# Patient Record
Sex: Female | Born: 1985 | Race: Black or African American | Hispanic: No | Marital: Single | Smoking: Never smoker
Health system: Southern US, Community
[De-identification: ages and names within clinical notes are randomized; demographics above are authoritative.]

## PROBLEM LIST (undated history)

## (undated) DIAGNOSIS — E119 Type 2 diabetes mellitus without complications: Secondary | ICD-10-CM

## (undated) DIAGNOSIS — F1910 Other psychoactive substance abuse, uncomplicated: Secondary | ICD-10-CM

## (undated) HISTORY — PX: KNEE SURGERY: SHX244

## (undated) HISTORY — PX: WRIST SURGERY: SHX841

---

## 2019-02-17 ENCOUNTER — Other Ambulatory Visit: Payer: Self-pay

## 2019-02-17 ENCOUNTER — Emergency Department (HOSPITAL_COMMUNITY)
Admission: EM | Admit: 2019-02-17 | Discharge: 2019-02-18 | Disposition: A | Discharge status: Home / Self Care | Payer: Medicare Other | Attending: Emergency Medicine | Admitting: Emergency Medicine

## 2019-02-17 ENCOUNTER — Encounter (HOSPITAL_COMMUNITY): Payer: Self-pay | Admitting: Emergency Medicine

## 2019-02-17 DIAGNOSIS — R739 Hyperglycemia, unspecified: Secondary | ICD-10-CM | POA: Diagnosis present

## 2019-02-17 DIAGNOSIS — Z5321 Procedure and treatment not carried out due to patient leaving prior to being seen by health care provider: Secondary | ICD-10-CM | POA: Diagnosis not present

## 2019-02-17 HISTORY — DX: Type 2 diabetes mellitus without complications: E11.9

## 2019-02-17 NOTE — ED Triage Notes (Signed)
Patient complaining of not feeling good. Patient states that her sugar has been high for last few days. Patient brought in by [facility]. Patient refused care in the back of the ambulance.

## 2019-02-17 NOTE — ED Notes (Addendum)
Pt refuses blood work CBG and is currently eating a bag of cheese puffs and is drinking soda which she also refuses to give up or stop eating. "This is my personal food. Y'all dont have to feed me I will eat it if I want to."

## 2019-02-18 ENCOUNTER — Encounter (HOSPITAL_COMMUNITY): Payer: Self-pay | Admitting: Emergency Medicine

## 2019-02-18 ENCOUNTER — Other Ambulatory Visit: Payer: Self-pay

## 2019-02-18 ENCOUNTER — Emergency Department (EMERGENCY_DEPARTMENT_HOSPITAL)
Admission: EM | Admit: 2019-02-18 | Discharge: 2019-02-19 | Disposition: A | Discharge status: Home / Self Care | Payer: Medicare Other | Source: Home / Self Care | Attending: Emergency Medicine | Admitting: Emergency Medicine

## 2019-02-18 ENCOUNTER — Emergency Department (HOSPITAL_COMMUNITY)
Admission: EM | Admit: 2019-02-18 | Discharge: 2019-02-18 | Disposition: A | Discharge status: Home / Self Care | Payer: Medicare Other | Attending: Emergency Medicine | Admitting: Emergency Medicine

## 2019-02-18 DIAGNOSIS — R739 Hyperglycemia, unspecified: Secondary | ICD-10-CM

## 2019-02-18 DIAGNOSIS — F419 Anxiety disorder, unspecified: Secondary | ICD-10-CM | POA: Diagnosis not present

## 2019-02-18 DIAGNOSIS — R45851 Suicidal ideations: Secondary | ICD-10-CM

## 2019-02-18 DIAGNOSIS — F1590 Other stimulant use, unspecified, uncomplicated: Secondary | ICD-10-CM | POA: Insufficient documentation

## 2019-02-18 DIAGNOSIS — E1165 Type 2 diabetes mellitus with hyperglycemia: Secondary | ICD-10-CM | POA: Diagnosis present

## 2019-02-18 DIAGNOSIS — Z794 Long term (current) use of insulin: Secondary | ICD-10-CM | POA: Insufficient documentation

## 2019-02-18 DIAGNOSIS — F1494 Cocaine use, unspecified with cocaine-induced mood disorder: Secondary | ICD-10-CM | POA: Diagnosis not present

## 2019-02-18 DIAGNOSIS — Z590 Homelessness: Secondary | ICD-10-CM | POA: Insufficient documentation

## 2019-02-18 LAB — CBC WITH DIFFERENTIAL/PLATELET
Abs Immature Granulocytes: 0.02 10*3/uL (ref 0.00–0.07)
Basophils Absolute: 0 10*3/uL (ref 0.0–0.1)
Basophils Relative: 0 %
Eosinophils Absolute: 0.2 10*3/uL (ref 0.0–0.5)
Eosinophils Relative: 3 %
HCT: 38.7 % (ref 36.0–46.0)
Hemoglobin: 12.3 g/dL (ref 12.0–15.0)
Immature Granulocytes: 0 %
Lymphocytes Relative: 47 %
Lymphs Abs: 4.2 10*3/uL — ABNORMAL HIGH (ref 0.7–4.0)
MCH: 26.4 pg (ref 26.0–34.0)
MCHC: 31.8 g/dL (ref 30.0–36.0)
MCV: 83 fL (ref 80.0–100.0)
Monocytes Absolute: 0.6 10*3/uL (ref 0.1–1.0)
Monocytes Relative: 7 %
Neutro Abs: 3.8 10*3/uL (ref 1.7–7.7)
Neutrophils Relative %: 43 %
Platelets: 373 10*3/uL (ref 150–400)
RBC: 4.66 MIL/uL (ref 3.87–5.11)
RDW: 15.5 % (ref 11.5–15.5)
WBC: 8.9 10*3/uL (ref 4.0–10.5)
nRBC: 0 % (ref 0.0–0.2)

## 2019-02-18 LAB — BASIC METABOLIC PANEL
Anion gap: 7 (ref 5–15)
BUN: <5 mg/dL — ABNORMAL LOW (ref 6–20)
CO2: 24 mmol/L (ref 22–32)
Calcium: 8.5 mg/dL — ABNORMAL LOW (ref 8.9–10.3)
Chloride: 103 mmol/L (ref 98–111)
Creatinine, Ser: 0.54 mg/dL (ref 0.44–1.00)
GFR calc Af Amer: >60 mL/min (ref >60)
GFR calc non Af Amer: >60 mL/min (ref >60)
Glucose, Bld: 438 mg/dL — ABNORMAL HIGH (ref 70–99)
Potassium: 3.9 mmol/L (ref 3.5–5.1)
Sodium: 134 mmol/L — ABNORMAL LOW (ref 135–145)

## 2019-02-18 LAB — CBG MONITORING, ED: Glucose-Capillary: 416 mg/dL — ABNORMAL HIGH (ref 70–99)

## 2019-02-18 LAB — I-STAT BETA HCG BLOOD, ED (MC, WL, AP ONLY): I-stat hCG, quantitative: <5 m[IU]/mL (ref <5)

## 2019-02-18 MED ORDER — SODIUM CHLORIDE 0.9 % IV BOLUS (SEPSIS)
1000.0000 mL | Freq: Once | INTRAVENOUS | Status: AC
  Administered 2019-02-18: 07:00:00 1000 mL via INTRAVENOUS

## 2019-02-18 MED ORDER — SODIUM CHLORIDE 0.9 % IV BOLUS (SEPSIS)
1000.0000 mL | Freq: Once | INTRAVENOUS | Status: AC
  Administered 2019-02-18: 1000 mL via INTRAVENOUS

## 2019-02-18 NOTE — ED Notes (Signed)
Bed: WLPT2 Expected date:  Expected time:  Means of arrival:  Comments: 

## 2019-02-18 NOTE — ED Provider Notes (Signed)
[place] DEPT Provider Note   CSN: [id number] Arrival date & time: 02/18/19  0406     History   Chief Complaint Chief Complaint  Patient presents with  . Hyperglycemia    HPI Dana Noble is a 33 y.o. female.     The history is provided by the patient.  Hyperglycemia Severity:  Moderate Onset quality:  Gradual Timing:  Constant Progression:  Worsening Chronicity:  New Relieved by:  Nothing Associated symptoms: no abdominal pain, no fever and no vomiting   Patient with history of diabetes presents with hyperglycemia She reports she does not have her insulin.  Denies fever/vomiting.  No cough or shortness of breath She reports she is homeless, and is exhausted because she can find nowhere to sleep She has no other complaints Past Medical History:  Diagnosis Date  . Diabetes mellitus without complication ([place])     There are no active problems to display for this patient.   History reviewed. No pertinent surgical history.   OB History   No obstetric history on file.      Home Medications    Prior to Admission medications   Not on File    Family History History reviewed. No pertinent family history.  Social History Social History   Tobacco Use  . Smoking status: Never Smoker  . Smokeless tobacco: Never Used  Substance Use Topics  . Alcohol use: Not Currently  . Drug use: Not Currently     Allergies   Patient has no known allergies.   Review of Systems Review of Systems  Constitutional: Negative for fever.  Gastrointestinal: Negative for abdominal pain and vomiting.  All other systems reviewed and are negative.    Physical Exam Updated Vital Signs BP 127/84 (BP Location: Right Arm)   Pulse 82   Temp 97.9 F (36.6 C) (Oral)   Resp 16   Ht 1.651 m (5\' 5" )   Wt 72.6 kg   SpO2 100%   BMI 26.63 kg/m   Physical Exam CONSTITUTIONAL: Disheveled, mildly anxious, walking around the room HEAD:  Normocephalic/atraumatic EYES: EOMI ENMT: Mucous membranes moist NECK: supple no meningeal signs CV: S1/S2 noted, no murmurs/rubs/gallops noted LUNGS: Lungs are clear to auscultation bilaterally, no apparent distress ABDOMEN: soft, nontender NEURO: Pt is awake/alert/appropriate, moves all extremitiesx4.  No facial droop.   EXTREMITIES: pulses normal/equal, full ROM SKIN: warm, color normal PSYCH: Anxious  ED Treatments / Results  Labs (all labs ordered are listed, but only abnormal results are displayed) Labs Reviewed  BASIC METABOLIC PANEL - Abnormal; Notable for the following components:      Result Value   Sodium 134 (*)    Glucose, Bld 438 (*)    BUN <5 (*)    Calcium 8.5 (*)    All other components within normal limits  CBC WITH DIFFERENTIAL/PLATELET - Abnormal; Notable for the following components:   Lymphs Abs 4.2 (*)    All other components within normal limits  CBG MONITORING, ED - Abnormal; Notable for the following components:   Glucose-Capillary 416 (*)    All other components within normal limits  I-STAT BETA HCG BLOOD, ED ([facility], [facility], [facility] ONLY)    EKG None  Radiology No results found.  Procedures Procedures   Medications Ordered in ED Medications  sodium chloride 0.9 % bolus 1,000 mL (has no administration in time range)  sodium chloride 0.9 % bolus 1,000 mL (1,000 mLs Intravenous New Bag/Given 02/18/19 0536)     Initial Impression /  Assessment and Plan / ED Course  I have reviewed the triage vital signs and the nursing notes.  Pertinent labs  results that were available during my care of the patient were reviewed by me and considered in my medical decision making (see chart for details).        Patient presents for evaluation for hyperglycemia.  She appears mildly anxious.  She initially refused fluids and labs, but she now is agreeable. She reports she does not have her insulin. 6:22 AM Patient resting comfortably, no signs of DKA. Hyperglycemia  noted. Plan will be to give another liter of IV fluids.  Will consult social work for medication assistance.  Otherwise will likely be discharged later today. Final Clinical Impressions(s) / ED Diagnoses   Final diagnoses:  Hyperglycemia    ED Discharge Orders    None       Zadie RhineWickline, Ade Stmarie, MD 02/18/19 [phone]

## 2019-02-18 NOTE — ED Triage Notes (Signed)
Patient came back due to be homeless and no where to go. Patient states she feels like her sugar high. Patient is eating chips and drinking non diet soda.

## 2019-02-18 NOTE — Progress Notes (Signed)
CSW spoke with patient via bedside. Patient stated she needed to find somewhere to sleep. CSW provided patient with a list of homeless shelter and explained the changed process of getting into a shelter with COVID-19 restrictions. Patient stated that she will make the calls and find a place to go. Patient stated that she did not need assistance with getting her medications. No further needs stated. CSW signing off, please reconsult if any other needs arise.   Golden Circle, LCSW Transitions of Care Department [facility] ED [phone]

## 2019-02-18 NOTE — ED Notes (Signed)
Patient does not want an IV or blood drawn until she speak to the doctor.

## 2019-02-18 NOTE — ED Notes (Signed)
Pt d/c home per MD order. Discharge summary reviewed with pt, resources provided. pt ambulatory. No s/s of distress noted.

## 2019-02-18 NOTE — ED Triage Notes (Signed)
Pt presents with being homeless and does not have anywhere to go and all the shelters are full. Pt tearful at time of triage. Pt currently denies any SI/HI.

## 2019-02-18 NOTE — ED Provider Notes (Signed)
Care assumed from Dr. Christy Gentles.  At time of transfer of care, patient is awaiting evaluation by the social work team to help determine what types of assistance patient is appropriate for.  Patient is reportedly new to the area and homeless and needs resources.  Plan of care is to check a glucose before she leaves to make sure it is not uptrending.  If it is, plan to give more fluids.  Glucose improved after fluids and pt given resources. PT discharged in good condition.    Clinical Impression: 1. Hyperglycemia     Disposition: Discharge  Condition: Good  I have discussed the results, Dx and Tx plan with the pt(& family if present). He/she/they expressed understanding and agree(s) with the plan. Discharge instructions discussed at great length. Strict return precautions discussed and pt &/or family have verbalized understanding of the instructions. No further questions at time of discharge.    There are no discharge medications for this patient.   Follow Up: [place] [phone] Schedule an appointment as soon as possible for a visit    [place] DEPT [place] [phone] [place]         Christna Kulick, Gwenyth Allegra, MD 02/18/19 1537

## 2019-02-18 NOTE — ED Notes (Signed)
Patient CBG 175

## 2019-02-18 NOTE — ED Notes (Signed)
Social worker at bedside.

## 2019-02-18 NOTE — Progress Notes (Signed)
Inpatient Diabetes Program Recommendations  AACE/ADA: New Consensus Statement on Inpatient Glycemic Control (2015)  Target Ranges:  Prepandial:   less than 140 mg/dL      Peak postprandial:   less than 180 mg/dL (1-2 hours)      Critically ill patients:  140 - 180 mg/dL   Lab Results  Component Value Date   GLUCAP 416 (H) 02/18/2019    Review of Glycemic Control Results for Dana Noble, Dana Noble (MRN [id number]) as of 02/18/2019 09:57  Ref. Range 02/18/2019 04:16  Glucose-Capillary Latest Ref Range: 70 - 99 mg/dL 416 (H)   Diabetes history: DM- patient states since age 62 Outpatient Diabetes medications:  Lantus/ Novolog (patient was unable to tell me doses) Current orders:  None noted  Inpatient Diabetes Program Recommendations:    Note patient in ER (2nd time coming in last 24 hours).  Spoke to patient by phone.  She was very distant on the phone and only answered after being asked questions multiple times.  She states that she has had diabetes since she was 11.  She had told Education officer, museum that she does not need assistance with medications, however after talking to her she states that she does not have insulin, meter, or a PCP. I told her that she would need these items in order to control her blood sugars and she states "ok".  RN and Education officer, museum state that patient is very irritable and does not seem to want to be bothered. Her address/information indicates she is homeless and there are no other encounters in Epic related to her medical history.  Called and discussed with Education officer, museum.  Also called case manager to discuss further.  ? Need for psych consult?   Will need f/u with clinic and prescriptions for medications.  Thanks  Adah Perl, RN, BC-ADM Inpatient Diabetes Coordinator Pager [phone] (8a-5p)

## 2019-02-18 NOTE — ED Notes (Signed)
Bed: WLPT4 Expected date:  Expected time:  Means of arrival:  Comments: 

## 2019-02-19 DIAGNOSIS — E1165 Type 2 diabetes mellitus with hyperglycemia: Secondary | ICD-10-CM | POA: Diagnosis not present

## 2019-02-19 DIAGNOSIS — F1494 Cocaine use, unspecified with cocaine-induced mood disorder: Secondary | ICD-10-CM | POA: Diagnosis present

## 2019-02-19 LAB — URINALYSIS, ROUTINE W REFLEX MICROSCOPIC
Bilirubin Urine: NEGATIVE
Glucose, UA: >=500 mg/dL — AB
Hgb urine dipstick: NEGATIVE
Ketones, ur: NEGATIVE mg/dL
Leukocytes,Ua: NEGATIVE
Nitrite: NEGATIVE
Protein, ur: NEGATIVE mg/dL
Specific Gravity, Urine: 1.02 (ref 1.005–1.030)
pH: 6 (ref 5.0–8.0)

## 2019-02-19 LAB — CBC WITH DIFFERENTIAL/PLATELET
Abs Immature Granulocytes: 0.02 10*3/uL (ref 0.00–0.07)
Basophils Absolute: 0 10*3/uL (ref 0.0–0.1)
Basophils Relative: 0 %
Eosinophils Absolute: 0.2 10*3/uL (ref 0.0–0.5)
Eosinophils Relative: 2 %
HCT: 38 % (ref 36.0–46.0)
Hemoglobin: 12 g/dL (ref 12.0–15.0)
Immature Granulocytes: 0 %
Lymphocytes Relative: 37 %
Lymphs Abs: 3.4 10*3/uL (ref 0.7–4.0)
MCH: 26.1 pg (ref 26.0–34.0)
MCHC: 31.6 g/dL (ref 30.0–36.0)
MCV: 82.6 fL (ref 80.0–100.0)
Monocytes Absolute: 0.5 10*3/uL (ref 0.1–1.0)
Monocytes Relative: 5 %
Neutro Abs: 5 10*3/uL (ref 1.7–7.7)
Neutrophils Relative %: 56 %
Platelets: 343 10*3/uL (ref 150–400)
RBC: 4.6 MIL/uL (ref 3.87–5.11)
RDW: 15.4 % (ref 11.5–15.5)
WBC: 9.2 10*3/uL (ref 4.0–10.5)
nRBC: 0 % (ref 0.0–0.2)

## 2019-02-19 LAB — COMPREHENSIVE METABOLIC PANEL
ALT: 12 U/L (ref 0–44)
AST: 12 U/L — ABNORMAL LOW (ref 15–41)
Albumin: 3.2 g/dL — ABNORMAL LOW (ref 3.5–5.0)
Alkaline Phosphatase: 76 U/L (ref 38–126)
Anion gap: 10 (ref 5–15)
BUN: 6 mg/dL (ref 6–20)
CO2: 22 mmol/L (ref 22–32)
Calcium: 8.9 mg/dL (ref 8.9–10.3)
Chloride: 101 mmol/L (ref 98–111)
Creatinine, Ser: 0.55 mg/dL (ref 0.44–1.00)
GFR calc Af Amer: >60 mL/min (ref >60)
GFR calc non Af Amer: >60 mL/min (ref >60)
Glucose, Bld: 432 mg/dL — ABNORMAL HIGH (ref 70–99)
Potassium: 4.2 mmol/L (ref 3.5–5.1)
Sodium: 133 mmol/L — ABNORMAL LOW (ref 135–145)
Total Bilirubin: <0.1 mg/dL — ABNORMAL LOW (ref 0.3–1.2)
Total Protein: 6.8 g/dL (ref 6.5–8.1)

## 2019-02-19 LAB — RAPID URINE DRUG SCREEN, HOSP PERFORMED
Amphetamines: NOT DETECTED
Barbiturates: NOT DETECTED
Benzodiazepines: NOT DETECTED
Cocaine: POSITIVE — AB
Opiates: NOT DETECTED
Tetrahydrocannabinol: NOT DETECTED

## 2019-02-19 LAB — I-STAT BETA HCG BLOOD, ED (MC, WL, AP ONLY): I-stat hCG, quantitative: <5 m[IU]/mL (ref <5)

## 2019-02-19 LAB — ETHANOL: Alcohol, Ethyl (B): <10 mg/dL (ref <10)

## 2019-02-19 NOTE — Discharge Instructions (Addendum)
Please follow-up with your outpatient psychiatry team.  If any symptoms change or worsen, please return to the extremity department.

## 2019-02-19 NOTE — ED Provider Notes (Signed)
9:32 AM Nursing reports that psychiatry saw the patient and felt that she was no longer suicidal and was safe for discharge.  Patient will be discharged to follow-up with outpatient psychiatry team.   Tegeler, Gwenyth Allegra, MD 02/19/19 1549

## 2019-02-19 NOTE — ED Provider Notes (Signed)
[place] DEPT Provider Note   CSN: [id number] Arrival date & time: 02/18/19  2253    History   Chief Complaint Chief Complaint  Patient presents with  . Suicidal    HPI Dana Noble is a 33 y.o. female with a PMH of Diabetes presenting with suicidal ideations onset today. Patient reports she is homeless and has been very depressed. Patient reports she has been trying to jump in front of cars today. Patient states she has not been able to find shelters and does not want to live. Patient states she has had trouble sleeping. Patient denies HI or hallucinations. Patient denies fever, cough, congestion, nausea, vomiting, abdominal pain, or diarrhea. Patient denies alcohol, tobacco, or drug use. Per chart review, patient was evaluated by the ER yesterday for hyperglycemia.     HPI  Past Medical History:  Diagnosis Date  . Diabetes mellitus without complication ([place])     There are no active problems to display for this patient.   History reviewed. No pertinent surgical history.   OB History   No obstetric history on file.      Home Medications    Prior to Admission medications   Not on File    Family History History reviewed. No pertinent family history.  Social History Social History   Tobacco Use  . Smoking status: Never Smoker  . Smokeless tobacco: Never Used  Substance Use Topics  . Alcohol use: Not Currently  . Drug use: Not Currently     Allergies   Patient has no known allergies.   Review of Systems Review of Systems  Constitutional: Negative for activity change, appetite change, fatigue, fever and unexpected weight change.  HENT: Negative for congestion, rhinorrhea and sore throat.   Respiratory: Negative for cough and shortness of breath.   Cardiovascular: Negative for chest pain.  Gastrointestinal: Negative for abdominal pain, diarrhea, nausea and vomiting.  Endocrine: Negative for cold intolerance and heat  intolerance.  Genitourinary: Negative for dysuria, flank pain and frequency.  Musculoskeletal: Negative for back pain.  Skin: Negative for rash.  Allergic/Immunologic: Negative for immunocompromised state.  Neurological: Negative for dizziness, weakness and headaches.  Psychiatric/Behavioral: Positive for dysphoric mood, sleep disturbance and suicidal ideas. Negative for agitation, behavioral problems, confusion, decreased concentration, hallucinations and self-injury. The patient is not nervous/anxious and is not hyperactive.     Physical Exam Updated Vital Signs BP (!) 148/91 (BP Location: Left Arm)   Pulse 79   Temp 98.2 F (36.8 C) (Oral)   Resp 18   LMP  (LMP Unknown)   SpO2 100%   Physical Exam Vitals signs and nursing note reviewed.  Constitutional:      General: She is not in acute distress.    Appearance: She is well-developed. She is not diaphoretic.     Comments: Patient is tearful throughout history.  HENT:     Head: Normocephalic and atraumatic.     Mouth/Throat:     Mouth: Mucous membranes are moist.     Pharynx: No oropharyngeal exudate or posterior oropharyngeal erythema.  Eyes:     Extraocular Movements: Extraocular movements intact.     Conjunctiva/sclera: Conjunctivae normal.     Pupils: Pupils are equal, round, and reactive to light.  Neck:     Musculoskeletal: Normal range of motion.  Cardiovascular:     Rate and Rhythm: Normal rate and regular rhythm.     Heart sounds: Normal heart sounds. No murmur. No friction rub. No gallop.   Pulmonary:  Effort: Pulmonary effort is normal. No respiratory distress.     Breath sounds: Normal breath sounds. No wheezing or rales.  Abdominal:     Palpations: Abdomen is soft.     Tenderness: There is no abdominal tenderness.  Musculoskeletal: Normal range of motion.  Skin:    General: Skin is warm.     Findings: No erythema or rash.  Neurological:     Mental Status: She is alert.  Psychiatric:         Attention and Perception: Attention normal.        Mood and Affect: Mood is depressed. Mood is not anxious or elated. Affect is angry and tearful. Affect is not labile, blunt, flat or inappropriate.        Speech: Speech normal.        Behavior: Behavior is agitated. Behavior is not slowed, aggressive, withdrawn, hyperactive or combative.        Thought Content: Thought content is not paranoid or delusional. Thought content includes suicidal ideation. Thought content does not include homicidal ideation. Thought content includes suicidal plan. Thought content does not include homicidal plan.        Cognition and Memory: Cognition normal.        Judgment: Judgment is impulsive.    ED Treatments / Results  Labs (all labs ordered are listed, but only abnormal results are displayed) Labs Reviewed  COMPREHENSIVE METABOLIC PANEL  ETHANOL  RAPID URINE DRUG SCREEN, HOSP PERFORMED  CBC WITH DIFFERENTIAL/PLATELET  URINALYSIS, ROUTINE W REFLEX MICROSCOPIC  I-STAT BETA HCG BLOOD, ED ([facility], [facility], [facility] ONLY)  CBG MONITORING, ED    EKG None  Radiology No results found.  Procedures Procedures (including critical care time)  Medications Ordered in ED Medications - No data to display   Initial Impression / Assessment and Plan / ED Course  I have reviewed the triage vital signs and the nursing notes.  Pertinent labs & imaging results that were available during my care of the patient were reviewed by me and considered in my medical decision making (see chart for details).       Patient presents with suicidal thoughts with a plan to run into traffic. Patient will need medical clearance labs prior to TTS consult. Vitals reviewed. Ordered labs. Labs are pending.   At shift change care was transferred to Ivar Drapeob Browning, PA-C who will follow pending studies, re-evaluate and determine disposition.    Final Clinical Impressions(s) / ED Diagnoses   Final diagnoses:  Suicidal ideation    ED Discharge  Orders    None       Leretha DykesHernandez, Emerick Weatherly P, [place]PA-C 02/19/19 0029    Dione BoozeGlick, David, MD 02/19/19 [phone]

## 2019-02-19 NOTE — ED Notes (Signed)
Patient stated that she wanted to leave her purple scrubs on and leave. I said,"I just want to put my shoes on and leave."

## 2019-02-19 NOTE — Consult Note (Addendum)
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 Pt denies suicidal/homicidal ideation, denies auditory/visual hallucinations and does not appear to be responding to internal stimuli. Pt stated she has been homeless for the past 2 months. She has been sleeping outside. She stated she has been using cocaine, UDS positive, BAL negative. Pt declined any resources from Peer Support. She stated she called police and said she was suicidal so she could come to the emergency room and sleep. She wants to be discharged today [facility]. She denies access to weapons and declined to give any contact for collateral information. She declined any outpatient resources offered to her and stated she had everything she needed.  Pt is psychiatrically clear.   Total Time spent with patient: 30 minutes  Past Psychiatric History: As above  Past Medical History:  Past Medical History:  Diagnosis Date  . Diabetes mellitus without complication (HCC)    History reviewed. No pertinent surgical history. Family History: History reviewed. No pertinent family history. Family Psychiatric  None per chart review.  Social History:  Social History   Substance and Sexual Activity  Alcohol Use Not Currently     Social History   Substance and Sexual Activity  Drug Use Not Currently    Social History   Socioeconomic History  . Marital status: Single    Spouse name: Not on file  . Number of children: Not on file  . Years of education: Not on file  . Highest education level: Not on file  Occupational History  . Not on file  Social Needs  . Financial resource strain: Not on file  . Food insecurity    Worry: Not on file    Inability: Not on file   . Transportation needs    Medical: Not on file    Non-medical: Not on file  Tobacco Use  . Smoking status: Never Smoker  . Smokeless tobacco: Never Used  Substance and Sexual Activity  . Alcohol use: Not Currently  . Drug use: Not Currently  . Sexual activity: Not on file  Lifestyle  . Physical activity    Days per week: Not on file    Minutes per session: Not on file  . Stress: Not on file  Relationships  . Social Musicianconnections    Talks on phone: Not on file    Gets together: Not on file    Attends religious service: Not on file    Active member of club or organization: Not on file    Attends meetings of clubs or organizations: Not on file    Relationship status: Not on file  Other Topics Concern  . Not on file  Social History Narrative  . Not on file    Has this patient used any form of tobacco in the last 30 days? (Cigarettes, Smokeless Tobacco, Cigars, and/or Pipes) Prescription not provided because: Patient does not use tobacco products.  Current Medications: No current facility-administered medications for this encounter.    No current outpatient medications on file.    Musculoskeletal: Strength & Muscle Tone: within normal limits Gait & Station: normal Patient leans: N/A  Psychiatric Specialty Exam: Physical Exam  Nursing note and vitals reviewed. Constitutional: She is oriented to person, place, and  time. She appears well-developed and well-nourished.  HENT:  Head: Normocephalic and atraumatic.  Neck: Normal range of motion.  Respiratory: Effort normal.  Musculoskeletal: Normal range of motion.  Neurological: She is alert and oriented to person, place, and time.  Psychiatric: She has a normal mood and affect. Her speech is normal and behavior is normal. Judgment and thought content normal. Cognition and memory are normal.    Review of Systems  Psychiatric/Behavioral: Positive for substance abuse. Negative for suicidal ideas.  All other systems reviewed  and are negative.   Blood pressure (!) 148/91, pulse 79, temperature 98.2 F (36.8 C), temperature source Oral, resp. rate 18, SpO2 100 %.There is no height or weight on file to calculate BMI.  General Appearance: Casual  Eye Contact:  Good  Speech:  Clear and Coherent and Normal Rate  Volume:  Normal  Mood:  Anxious  Affect:  Congruent  Thought Process:  Coherent and Descriptions of Associations: Intact  Orientation:  Full (Time, Place, and Person)  Thought Content:  Logical  Suicidal Thoughts:  No  Homicidal Thoughts:  No  Memory:  Immediate;   Good Recent;   Fair Remote;   Fair  Judgement:  Fair  Insight:  Fair  Psychomotor Activity:  Normal  Concentration:  Concentration: Good and Attention Span: Good  Recall:  Good  Fund of Knowledge:  Good  Language:  Good  Akathisia:  No  Handed:  Right  AIMS (if indicated):   N/A  Assets:  Communication Skills Physical Health Resilience  ADL's:  Intact  Cognition:  WNL  Sleep:   N/A     Demographic Factors:  Adolescent or young adult, Low socioeconomic status and Unemployed  Loss Factors: Financial problems/change in socioeconomic status  Historical Factors: NA  Risk Reduction Factors:   Sense of responsibility to family  Continued Clinical Symptoms:  Alcohol/Substance Abuse/Dependencies  Cognitive Features That Contribute To Risk:  Closed-mindedness    Suicide Risk:  Minimal: No identifiable suicidal ideation.  Patients presenting with no risk factors but with morbid ruminations; may be classified as minimal risk based on the severity of the depressive symptoms    Plan Of Care/Follow-up recommendations:  Activity:  as tolerated Diet:  heart healthy  Disposition and Treatment Plan: Cocaine-induced mood disorder ([place]) Take all medications as prescribed by your outpatient provider Keep all follow-up appointments as scheduled.  Do not consume alcohol or use illegal drugs while on prescription  medications. Report any adverse effects from your medications to your primary care provider promptly.  In the event of recurrent symptoms or worsening symptoms, call 911, a crisis hotline, or go to the nearest emergency department for evaluation.   Ethelene Hal, NP 02/19/2019, 12:08 PM   Patient's chart reviewed. Reviewed the information documented and agree with the treatment plan.  Buford Dresser, DO 02/19/19 5:33 PM

## 2019-02-19 NOTE — ED Provider Notes (Signed)
Signed out at shift change.   Patient is homeless and claims that she wants to run in front of moving traffic to kill herself.   TTS pending.   Montine Circle, PA-C 70/76/15 [id number]    Delora Fuel, MD 37/35/78 [phone]

## 2019-02-19 NOTE — ED Notes (Signed)
Pt refused to comply with orders to keep the door open.   Pt was confrontational with Security.   Pt has been changed and wand.

## 2019-02-19 NOTE — ED Notes (Signed)
Overheard patient stating she just said she was suicidal because she had no where to go-states all the shelters where closed-needing some where to sleep and wash up-denies SI at this time

## 2019-02-25 ENCOUNTER — Emergency Department (HOSPITAL_COMMUNITY)
Admission: EM | Admit: 2019-02-25 | Discharge: 2019-02-26 | Disposition: A | Discharge status: Home / Self Care | Payer: Medicare Other | Attending: Emergency Medicine | Admitting: Emergency Medicine

## 2019-02-25 ENCOUNTER — Other Ambulatory Visit: Payer: Self-pay

## 2019-02-25 ENCOUNTER — Encounter (HOSPITAL_COMMUNITY): Payer: Self-pay | Admitting: Emergency Medicine

## 2019-02-25 DIAGNOSIS — Z7189 Other specified counseling: Secondary | ICD-10-CM | POA: Insufficient documentation

## 2019-02-25 DIAGNOSIS — E1165 Type 2 diabetes mellitus with hyperglycemia: Secondary | ICD-10-CM | POA: Insufficient documentation

## 2019-02-25 DIAGNOSIS — Z590 Homelessness: Secondary | ICD-10-CM | POA: Diagnosis not present

## 2019-02-25 DIAGNOSIS — R739 Hyperglycemia, unspecified: Secondary | ICD-10-CM

## 2019-02-25 LAB — I-STAT BETA HCG BLOOD, ED (MC, WL, AP ONLY): I-stat hCG, quantitative: <5 m[IU]/mL (ref <5)

## 2019-02-25 LAB — BASIC METABOLIC PANEL
Anion gap: 10 (ref 5–15)
BUN: 5 mg/dL — ABNORMAL LOW (ref 6–20)
CO2: 23 mmol/L (ref 22–32)
Calcium: 8.7 mg/dL — ABNORMAL LOW (ref 8.9–10.3)
Chloride: 99 mmol/L (ref 98–111)
Creatinine, Ser: 0.64 mg/dL (ref 0.44–1.00)
GFR calc Af Amer: >60 mL/min (ref >60)
GFR calc non Af Amer: >60 mL/min (ref >60)
Glucose, Bld: 377 mg/dL — ABNORMAL HIGH (ref 70–99)
Potassium: 3.6 mmol/L (ref 3.5–5.1)
Sodium: 132 mmol/L — ABNORMAL LOW (ref 135–145)

## 2019-02-25 LAB — URINALYSIS, ROUTINE W REFLEX MICROSCOPIC
Bilirubin Urine: NEGATIVE
Glucose, UA: >=500 mg/dL — AB
Hgb urine dipstick: NEGATIVE
Ketones, ur: NEGATIVE mg/dL
Leukocytes,Ua: NEGATIVE
Nitrite: NEGATIVE
Protein, ur: NEGATIVE mg/dL
Specific Gravity, Urine: 1.029 (ref 1.005–1.030)
pH: 5 (ref 5.0–8.0)

## 2019-02-25 LAB — CBC
HCT: 36.3 % (ref 36.0–46.0)
Hemoglobin: 11.6 g/dL — ABNORMAL LOW (ref 12.0–15.0)
MCH: 26 pg (ref 26.0–34.0)
MCHC: 32 g/dL (ref 30.0–36.0)
MCV: 81.2 fL (ref 80.0–100.0)
Platelets: 372 10*3/uL (ref 150–400)
RBC: 4.47 MIL/uL (ref 3.87–5.11)
RDW: 15.3 % (ref 11.5–15.5)
WBC: 7.9 10*3/uL (ref 4.0–10.5)
nRBC: 0 % (ref 0.0–0.2)

## 2019-02-25 NOTE — ED Triage Notes (Signed)
Pt arrives via [facility] stating that she wanted to come here because she is homeless and wants to talk to a Education officer, museum. Pt has hx of DM, cbg 508, pt has not been taking her medications.

## 2019-02-25 NOTE — ED Notes (Addendum)
PT called for vitals x3, PT is outside at this time.

## 2019-02-26 DIAGNOSIS — E1165 Type 2 diabetes mellitus with hyperglycemia: Secondary | ICD-10-CM | POA: Diagnosis not present

## 2019-02-26 LAB — CBG MONITORING, ED: Glucose-Capillary: 453 mg/dL — ABNORMAL HIGH (ref 70–99)

## 2019-02-26 MED ORDER — SODIUM CHLORIDE 0.9 % IV BOLUS
1000.0000 mL | Freq: Once | INTRAVENOUS | Status: AC
  Administered 2019-02-26: 1000 mL via INTRAVENOUS

## 2019-02-26 MED ORDER — FREESTYLE SYSTEM KIT: 1.0000 | PACK | 0 refills | Status: DC | PRN

## 2019-02-26 NOTE — ED Notes (Signed)
Pt refused to let vitals be taken

## 2019-02-26 NOTE — Discharge Instructions (Signed)
You did not want to wait to speak with the social worker or case Freight forwarder.  You may return to speak with him later today.  You should check your blood sugar with the meter and keep a record of this.  Establish care with a primary doctor.  Return to the ED with new or worsening symptoms.

## 2019-02-26 NOTE — ED Provider Notes (Signed)
[facility]   CSN: [id number] Arrival date & time: 02/25/19  2131     History   Chief Complaint Chief Complaint  Patient presents with  . Homeless  . Hyperglycemia    HPI Dana Noble is a 33 y.o. female.     When asked why she is in the ED, patient states Pyreddy told 7 nurses are not telling you.  Per her triage note she is homeless and complaining of hyperglycemia wanting to see the Child psychotherapistsocial worker.  Patient states she has insulin at home but cannot afford it has no way to steroid.  She wants to talk to the social worker about having a place to stay.  She states she has been on metformin in the past but could not tolerate it due to vomiting.  States her legs hurt from walking.  She admits to being homeless.  Denies any abdominal pain, nausea or vomiting or fever.  She denies any pain with urination or blood in the urine.  She denies any hallucinations, suicidal thoughts or homicidal thoughts.  The history is provided by the patient.  Hyperglycemia Associated symptoms: no abdominal pain, no chest pain, no dizziness, no dysuria, no fever, no nausea, no vomiting and no weakness     Past Medical History:  Diagnosis Date  . Diabetes mellitus without complication [facility](HCC)     Patient Active Problem List   Diagnosis Date Noted  . 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 No pertinent surgical history.   OB History   No obstetric history on file.      Home Medications    Prior to Admission medications   Not on File    Family History No family history on file.  Social History Social History   Tobacco Use  . Smoking status: Never Smoker  . Smokeless tobacco: Never Used  Substance Use Topics  . Alcohol use: Not Currently  . Drug use: Not Currently     Allergies   Patient has no known allergies.   Review of Systems Review of Systems  Constitutional: Negative for activity change,  appetite change and fever.  HENT: Negative for congestion and rhinorrhea.   Respiratory: Negative for chest tightness.   Cardiovascular: Negative for chest pain.  Gastrointestinal: Negative for abdominal pain, nausea and vomiting.  Genitourinary: Negative for dysuria and hematuria.  Musculoskeletal: Positive for arthralgias and myalgias.  Skin: Negative for rash.  Neurological: Negative for dizziness, weakness and headaches.  Psychiatric/Behavioral: Negative for decreased concentration and suicidal ideas. The patient is not nervous/anxious.    all other systems are negative except as noted in the HPI and PMH.     Physical Exam Updated Vital Signs BP 124/73 (BP Location: Right Arm)   Pulse 76   Temp 98.9 F (37.2 C) (Oral)   Resp 20   LMP  (LMP Unknown)   SpO2 99%   Physical Exam Vitals signs and nursing note reviewed.  Constitutional:      General: She is not in acute distress.    Appearance: She is well-developed.     Comments: Argumentative  HENT:     Head: Normocephalic and atraumatic.     Mouth/Throat:     Pharynx: No oropharyngeal exudate.     Comments: Mildly dry mucous membranes Eyes:     Conjunctiva/sclera: Conjunctivae normal.     Pupils: Pupils are equal, round, and reactive to light.  Neck:     Musculoskeletal: Normal range of  motion and neck supple.     Comments: No meningismus. Cardiovascular:     Rate and Rhythm: Normal rate and regular rhythm.     Heart sounds: Normal heart sounds. No murmur.  Pulmonary:     Effort: Pulmonary effort is normal. No respiratory distress.     Breath sounds: Normal breath sounds.  Abdominal:     Palpations: Abdomen is soft.     Tenderness: There is no abdominal tenderness. There is no guarding or rebound.  Musculoskeletal: Normal range of motion.        General: No tenderness.  Skin:    General: Skin is warm.     Capillary Refill: Capillary refill takes less than 2 seconds.  Neurological:     General: No focal deficit  present.     Mental Status: She is alert and oriented to person, place, and time. Mental status is at baseline.     Cranial Nerves: No cranial nerve deficit.     Motor: No abnormal muscle tone.     Coordination: Coordination normal.     Comments:  5/5 strength throughout. CN 2-12 intact.Equal grip strength.   Psychiatric:        Behavior: Behavior normal.      ED Treatments / Results  Labs (all labs ordered are listed, but only abnormal results are displayed) Labs Reviewed  BASIC METABOLIC PANEL - Abnormal; Notable for the following components:      Result Value   Sodium 132 (*)    Glucose, Bld 377 (*)    BUN 5 (*)    Calcium 8.7 (*)    All other components within normal limits  CBC - Abnormal; Notable for the following components:   Hemoglobin 11.6 (*)    All other components within normal limits  URINALYSIS, ROUTINE W REFLEX MICROSCOPIC - Abnormal; Notable for the following components:   Glucose, UA >=500 (*)    Bacteria, UA RARE (*)    All other components within normal limits  I-STAT BETA HCG BLOOD, ED ([facility], [facility], [facility] ONLY)  CBG MONITORING, ED    EKG None  Radiology No results found.  Procedures Procedures (including critical care time)  Medications Ordered in ED Medications  sodium chloride 0.9 % bolus 1,000 mL (has no administration in time range)     Initial Impression / Assessment and Plan / ED Course  I have reviewed the triage vital signs and the nursing notes.  Pertinent labs & imaging results that were available during my care of the patient were reviewed by me and considered in my medical decision making (see chart for details).        Patient here with hyperglycemia.  Admits to noncompliance at home.  Not suicidal or homicidal.  Has been seen by TTS last week and cleared.  Her blood work is reassuring without evidence of DKA.  States she needs to talk to the Education officer, museum.  Discussed that they are not here at this time of the night.  Patient  has changed her mind and decided she is not going to wait to see the social worker or case Freight forwarder.  She is given a prescription for a glucometer.  Per Education officer, museum note she was seen by them last week and given a list of resources. This was re-provided to her.  She is not suicidal homicidal.  She needs to establish care with a PCP.  Return precautions discussed.  Final Clinical Impressions(s) / ED Diagnoses   Final diagnoses:  None  ED Discharge Orders    None       Zollie Ellery, Jeannett SeniorStephen, MD 02/26/19 [phone]

## 2019-02-26 NOTE — ED Notes (Signed)
PT called x3 no answer for vitals recheck.

## 2019-02-26 NOTE — Discharge Planning (Signed)
[facility] consulted regarding medication assistance for insured patient.  EDCM will confirm that pt has not loss coverage lately.

## 2019-02-26 NOTE — ED Notes (Signed)
Pt discharged from ED; instructions provided and scripts given; Pt encouraged to return to ED if symptoms worsen and to f/u with PCP; Pt verbalized understanding of all instructions 

## 2019-02-28 ENCOUNTER — Emergency Department (EMERGENCY_DEPARTMENT_HOSPITAL)
Admission: EM | Admit: 2019-02-28 | Discharge: 2019-03-01 | Disposition: A | Discharge status: Home / Self Care | Payer: Medicare Other | Source: Home / Self Care | Attending: Emergency Medicine | Admitting: Emergency Medicine

## 2019-02-28 ENCOUNTER — Emergency Department (HOSPITAL_COMMUNITY)
Admission: EM | Admit: 2019-02-28 | Discharge: 2019-02-28 | Disposition: A | Discharge status: Home / Self Care | Payer: Medicare Other | Attending: Emergency Medicine | Admitting: Emergency Medicine

## 2019-02-28 ENCOUNTER — Other Ambulatory Visit: Payer: Self-pay

## 2019-02-28 ENCOUNTER — Observation Stay (HOSPITAL_COMMUNITY): Admission: AD | Admit: 2019-02-28 | Payer: Medicare Other | Source: Intra-hospital | Admitting: Psychiatry

## 2019-02-28 ENCOUNTER — Encounter (HOSPITAL_COMMUNITY): Payer: Self-pay | Admitting: Emergency Medicine

## 2019-02-28 ENCOUNTER — Encounter (HOSPITAL_COMMUNITY): Payer: Self-pay

## 2019-02-28 DIAGNOSIS — Z5900 Homelessness unspecified: Secondary | ICD-10-CM

## 2019-02-28 DIAGNOSIS — Z765 Malingerer [conscious simulation]: Secondary | ICD-10-CM

## 2019-02-28 DIAGNOSIS — Z046 Encounter for general psychiatric examination, requested by authority: Secondary | ICD-10-CM | POA: Diagnosis present

## 2019-02-28 DIAGNOSIS — F314 Bipolar disorder, current episode depressed, severe, without psychotic features: Secondary | ICD-10-CM | POA: Insufficient documentation

## 2019-02-28 DIAGNOSIS — R45851 Suicidal ideations: Secondary | ICD-10-CM

## 2019-02-28 DIAGNOSIS — F4325 Adjustment disorder with mixed disturbance of emotions and conduct: Secondary | ICD-10-CM | POA: Diagnosis not present

## 2019-02-28 DIAGNOSIS — Z20828 Contact with and (suspected) exposure to other viral communicable diseases: Secondary | ICD-10-CM | POA: Insufficient documentation

## 2019-02-28 DIAGNOSIS — F332 Major depressive disorder, recurrent severe without psychotic features: Secondary | ICD-10-CM | POA: Diagnosis not present

## 2019-02-28 DIAGNOSIS — E119 Type 2 diabetes mellitus without complications: Secondary | ICD-10-CM | POA: Insufficient documentation

## 2019-02-28 LAB — COMPREHENSIVE METABOLIC PANEL
ALT: 12 U/L (ref 0–44)
AST: 13 U/L — ABNORMAL LOW (ref 15–41)
Albumin: 3.3 g/dL — ABNORMAL LOW (ref 3.5–5.0)
Alkaline Phosphatase: 93 U/L (ref 38–126)
Anion gap: 8 (ref 5–15)
BUN: 5 mg/dL — ABNORMAL LOW (ref 6–20)
CO2: 23 mmol/L (ref 22–32)
Calcium: 8.8 mg/dL — ABNORMAL LOW (ref 8.9–10.3)
Chloride: 103 mmol/L (ref 98–111)
Creatinine, Ser: 0.55 mg/dL (ref 0.44–1.00)
GFR calc Af Amer: >60 mL/min (ref >60)
GFR calc non Af Amer: >60 mL/min (ref >60)
Glucose, Bld: 345 mg/dL — ABNORMAL HIGH (ref 70–99)
Potassium: 4 mmol/L (ref 3.5–5.1)
Sodium: 134 mmol/L — ABNORMAL LOW (ref 135–145)
Total Bilirubin: 0.5 mg/dL (ref 0.3–1.2)
Total Protein: 7.2 g/dL (ref 6.5–8.1)

## 2019-02-28 LAB — RAPID URINE DRUG SCREEN, HOSP PERFORMED
Amphetamines: NOT DETECTED
Barbiturates: NOT DETECTED
Benzodiazepines: NOT DETECTED
Cocaine: POSITIVE — AB
Opiates: NOT DETECTED
Tetrahydrocannabinol: NOT DETECTED

## 2019-02-28 LAB — CBG MONITORING, ED
Glucose-Capillary: 271 mg/dL — ABNORMAL HIGH (ref 70–99)
Glucose-Capillary: 325 mg/dL — ABNORMAL HIGH (ref 70–99)

## 2019-02-28 LAB — CBC WITH DIFFERENTIAL/PLATELET
Abs Immature Granulocytes: 0.02 10*3/uL (ref 0.00–0.07)
Basophils Absolute: 0 10*3/uL (ref 0.0–0.1)
Basophils Relative: 1 %
Eosinophils Absolute: 0.2 10*3/uL (ref 0.0–0.5)
Eosinophils Relative: 2 %
HCT: 41.7 % (ref 36.0–46.0)
Hemoglobin: 13.1 g/dL (ref 12.0–15.0)
Immature Granulocytes: 0 %
Lymphocytes Relative: 39 %
Lymphs Abs: 2.9 10*3/uL (ref 0.7–4.0)
MCH: 25.9 pg — ABNORMAL LOW (ref 26.0–34.0)
MCHC: 31.4 g/dL (ref 30.0–36.0)
MCV: 82.6 fL (ref 80.0–100.0)
Monocytes Absolute: 0.4 10*3/uL (ref 0.1–1.0)
Monocytes Relative: 6 %
Neutro Abs: 3.9 10*3/uL (ref 1.7–7.7)
Neutrophils Relative %: 52 %
Platelets: 380 10*3/uL (ref 150–400)
RBC: 5.05 MIL/uL (ref 3.87–5.11)
RDW: 15.4 % (ref 11.5–15.5)
WBC: 7.4 10*3/uL (ref 4.0–10.5)
nRBC: 0 % (ref 0.0–0.2)

## 2019-02-28 LAB — ETHANOL: Alcohol, Ethyl (B): <10 mg/dL (ref <10)

## 2019-02-28 LAB — SARS CORONAVIRUS 2 BY RT PCR (HOSPITAL ORDER, PERFORMED IN ~~LOC~~ HOSPITAL LAB): SARS Coronavirus 2: NEGATIVE

## 2019-02-28 LAB — I-STAT BETA HCG BLOOD, ED (MC, WL, AP ONLY): I-stat hCG, quantitative: <5 m[IU]/mL (ref <5)

## 2019-02-28 MED ORDER — METFORMIN HCL 500 MG PO TABS: 500.0000 mg | ORAL_TABLET | Freq: Two times a day (BID) | ORAL | Status: DC

## 2019-02-28 NOTE — ED Triage Notes (Signed)
TTS done 
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
## 2019-02-28 NOTE — ED Notes (Signed)
Pt was re-informed about the snack/food policy that [facility] has while you are a patient. Pt has been getting her sitter to give her snacks & drinks frequently. Pt was explained that water can be given regularly if needed but snacks were less frequented that she is requesting. Pt stated she understood & claimed that where she is homeless she just needed to "rehydrate herself" from being on the streets (per pt).

## 2019-02-28 NOTE — ED Notes (Signed)
Pt informed that she cannot close her door completely. Pt asked if she could "keep the door cracked". Pt stated that she wanted to "get some sleep". Door left slightly ajar.

## 2019-02-28 NOTE — ED Notes (Signed)
Pt verbally aggressive toward staff. Loudly demanding a snack that she reports "was not given at 10am" states that she wants to leave. EDP made aware that pt wants to go home. Pt slammed door. And was asked to open it and became increasingly aggressive. EDP came to bedside and spoke with patient.
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
## 2019-02-28 NOTE — ED Notes (Signed)
All appropriate discharge materials reviewed with patient at length. Time for questions provided. Pt denies any further questions at this time. Verbalizes understanding of all provided materials.  

## 2019-02-28 NOTE — ED Notes (Signed)
Pt given orange juice,

## 2019-02-28 NOTE — ED Triage Notes (Signed)
Pt presents to ED by [facility]. Pt c/o SI w/ no plan. Pt is homeless hx of noncompliance w/ meds. EMS cbg 389. EMS VSS.

## 2019-02-28 NOTE — ED Provider Notes (Signed)
Patient seen at the request of staff.  Patient is loudly demanding to be discharged.  She is not on current IVC hold.  She was being observed overnight.  She now desires discharge.  .  She does not appear to be an acute threat to herself or to others.  She appears at this time to have capacity to refuse care.  Strict return precautions given and understood.  She does understand the need for close follow-up    Valarie Merino, MD 02/28/19 [phone]

## 2019-02-28 NOTE — Discharge Instructions (Signed)
Return for any problem.  Follow-up with your regular care provider as instructed. °

## 2019-02-28 NOTE — ED Notes (Signed)
Lunch tray ordered 

## 2019-03-01 ENCOUNTER — Encounter (HOSPITAL_COMMUNITY): Payer: Self-pay | Admitting: Registered Nurse

## 2019-03-01 DIAGNOSIS — Z590 Homelessness: Secondary | ICD-10-CM | POA: Diagnosis not present

## 2019-03-01 DIAGNOSIS — Z765 Malingerer [conscious simulation]: Secondary | ICD-10-CM

## 2019-03-01 DIAGNOSIS — F4325 Adjustment disorder with mixed disturbance of emotions and conduct: Secondary | ICD-10-CM | POA: Diagnosis present

## 2019-03-01 DIAGNOSIS — F332 Major depressive disorder, recurrent severe without psychotic features: Secondary | ICD-10-CM | POA: Diagnosis not present

## 2019-03-01 LAB — I-STAT BETA HCG BLOOD, ED (MC, WL, AP ONLY): I-stat hCG, quantitative: <5 m[IU]/mL (ref <5)

## 2019-03-01 MED ORDER — ONDANSETRON HCL 4 MG PO TABS: 4.0000 mg | ORAL_TABLET | Freq: Three times a day (TID) | ORAL | Status: DC | PRN

## 2019-03-01 MED ORDER — ALUM & MAG HYDROXIDE-SIMETH 200-200-20 MG/5ML PO SUSP: 30.0000 mL | Freq: Four times a day (QID) | ORAL | Status: DC | PRN

## 2019-03-01 MED ORDER — NICOTINE 21 MG/24HR TD PT24: 21.0000 mg | MEDICATED_PATCH | Freq: Every day | TRANSDERMAL | Status: DC

## 2019-03-01 MED ORDER — ACETAMINOPHEN 325 MG PO TABS: 650.0000 mg | ORAL_TABLET | ORAL | Status: DC | PRN

## 2019-03-01 NOTE — ED Notes (Signed)
[place] 12.

## 2019-03-01 NOTE — ED Provider Notes (Signed)
[facility] EMERGENCY DEPARTMENT Provider Note   CSN: [id number] Arrival date & time: 02/28/19  0408     History   Chief Complaint Chief Complaint  Patient presents with  . Suicidal    HPI Dana Noble is a 33 y.o. female.      Mental Health Problem Presenting symptoms: suicidal threats   Degree of incapacity (severity):  Mild Onset quality:  Sudden Timing:  Intermittent Progression:  Worsening Chronicity:  New Context: noncompliance   Context: not alcohol use   Treatment compliance:  Untreated Relieved by:  None tried Worsened by:  Nothing Ineffective treatments:  None tried Associated symptoms: anhedonia, distractible, fatigue, irritability and poor judgment   Associated symptoms: no abdominal pain     Past Medical History:  Diagnosis Date  . Diabetes mellitus without complication [facility])     Patient Active Problem List   Diagnosis Date Noted  . Cocaine-induced mood disorder ([place]) 02/19/2019    History reviewed. No pertinent surgical history.   OB History   No obstetric history on file.      Home Medications    Prior to Admission medications   Medication Sig Start Date End Date Taking? Authorizing Provider  glucose monitoring kit (FREESTYLE) monitoring kit 1 each by Does not apply route as needed for other. 02/26/19   Ezequiel Essex, MD    Family History History reviewed. No pertinent family history.  Social History Social History   Tobacco Use  . Smoking status: Never Smoker  . Smokeless tobacco: Never Used  Substance Use Topics  . Alcohol use: Not Currently  . Drug use: Not Currently     Allergies   Patient has no known allergies.   Review of Systems Review of Systems  Constitutional: Positive for fatigue and irritability.  Gastrointestinal: Negative for abdominal pain.  All other systems reviewed and are negative.    Physical Exam Updated Vital Signs BP 128/78 (BP Location: Right Arm)   Pulse 84    Temp 98.5 F (36.9 C) (Oral)   Resp 18   Ht _0  (1.702 m)   Wt 75.5 kg   LMP  (LMP Unknown)   SpO2 100%   BMI 26.07 kg/m   Physical Exam Vitals signs and nursing note reviewed.  Constitutional:      Appearance: She is well-developed.  HENT:     Head: Normocephalic and atraumatic.     Nose: Nose normal. No congestion or rhinorrhea.     Mouth/Throat:     Mouth: Mucous membranes are dry.     Pharynx: Oropharynx is clear.  Eyes:     Extraocular Movements: Extraocular movements intact.     Conjunctiva/sclera: Conjunctivae normal.  Neck:     Musculoskeletal: Normal range of motion.  Cardiovascular:     Rate and Rhythm: Normal rate and regular rhythm.  Pulmonary:     Effort: No respiratory distress.     Breath sounds: No stridor.  Abdominal:     General: There is no distension.  Musculoskeletal: Normal range of motion.  Skin:    General: Skin is warm and dry.  Neurological:     General: No focal deficit present.     Mental Status: She is alert.      ED Treatments / Results  Labs (all labs ordered are listed, but only abnormal results are displayed) Labs Reviewed  COMPREHENSIVE METABOLIC PANEL - Abnormal; Notable for the following components:      Result Value   Sodium 134 (*)  Glucose, Bld 345 (*)    BUN 5 (*)    Calcium 8.8 (*)    Albumin 3.3 (*)    AST 13 (*)    All other components within normal limits  RAPID URINE DRUG SCREEN, HOSP PERFORMED - Abnormal; Notable for the following components:   Cocaine POSITIVE (*)    All other components within normal limits  CBC WITH DIFFERENTIAL/PLATELET - Abnormal; Notable for the following components:   MCH 25.9 (*)    All other components within normal limits  CBG MONITORING, ED - Abnormal; Notable for the following components:   Glucose-Capillary 271 (*)    All other components within normal limits  CBG MONITORING, ED - Abnormal; Notable for the following components:   Glucose-Capillary 325 (*)    All other  components within normal limits  SARS CORONAVIRUS 2 (HOSPITAL ORDER, [place] [facility])  ETHANOL  I-STAT BETA HCG BLOOD, ED ([facility], [facility], [facility] ONLY)  I-STAT BETA HCG BLOOD, ED ([facility], [facility], [facility] ONLY)    EKG None  Radiology No results found.  Procedures Procedures (including critical care time)  Medications Ordered in ED Medications - No data to display   Initial Impression / Assessment and Plan / ED Course  I have reviewed the triage vital signs and the nursing notes.  Pertinent labs & imaging results that were available during my care of the patient were reviewed by me and considered in my medical decision making (see chart for details).    Endorses SI without plan, suspect malingering. TTS to consult. Medically cleared.   Final Clinical Impressions(s) / ED Diagnoses   Final diagnoses:  Homelessness    ED Discharge Orders    None       Rebecka Oelkers, Corene Cornea, MD 03/01/19 0111

## 2019-03-01 NOTE — ED Notes (Signed)
Pt provided with washcloths, towels, soap, and warm water to clean herself as requested.
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
## 2019-03-01 NOTE — Consult Note (Addendum)
[facility] Psych ED Discharge  7/49/4496 7:59 PM Dana Noble  MRN:  [id number] Principal Problem: Adjustment disorder with mixed disturbance of emotions and conduct Discharge Diagnoses: Principal Problem:   Adjustment disorder with mixed disturbance of emotions and conduct Active Problems:   Malingering   Subjective: Patient seen via tele psych by this provider, Dr. Mariea Clonts; and  chart reviewed on 03/01/19.  On evaluation Dana Noble reports that she is not feeling well and that she needs help.  Patient asked what she needed help with and she responded.  "I don't know.  I just know that I don't feel well, I'm depressed and I need help."  Patient asked what was causing her to feel depressed.  "I don't know, that's just the way I feel."  Patient states that she is homeless and that she is not currently have outpatient psychiatric services; when asked when she was last seen out patient or if she had been inpatient patient responded "I don't know."  Patient denies suicidal/self-harm/homicidal ideation, psychosis, and paranoia.      During evaluation Dana Noble is laying in bed with covers pulled up to neck; she is irritable, and agitated with questions, yelling "I don't know" to most questions.  She is alert/oriented x 4; Patient is speaking in a clear tone at increased volume, and normal pace; with eyes closed mostly; not wanting to sit up; wanting to be left alone so she can sleep.  Her thought process is coherent and relevant; but refuses to answer most questions.  There is no indication that she is currently responding to internal/external stimuli or experiencing delusional thought content.  Patient denies suicidal/self-harm/homicidal ideation, psychosis, and paranoia. Patient appears to be malingering for secondary gain related to homelessness.  Patient was at [facility] earlier; prior to going to [facility] where she was demanding to be discharged.    Total Time spent with patient: 30 minutes  Past  Psychiatric History: Depression  Past Medical History:  Past Medical History:  Diagnosis Date  . Diabetes mellitus without complication ([place])    History reviewed. No pertinent surgical history. Family History: History reviewed. No pertinent family history. Family Psychiatric  History: Denies Social History:  Social History   Substance and Sexual Activity  Alcohol Use Not Currently     Social History   Substance and Sexual Activity  Drug Use Not Currently    Social History   Socioeconomic History  . Marital status: Single    Spouse name: Not on file  . Number of children: Not on file  . Years of education: Not on file  . Highest education level: Not on file  Occupational History  . Not on file  Social Needs  . Financial resource strain: Not on file  . Food insecurity    Worry: Not on file    Inability: Not on file  . Transportation needs    Medical: Not on file    Non-medical: Not on file  Tobacco Use  . Smoking status: Never Smoker  . Smokeless tobacco: Never Used  Substance and Sexual Activity  . Alcohol use: Not Currently  . Drug use: Not Currently  . Sexual activity: Not on file  Lifestyle  . Physical activity    Days per week: Not on file    Minutes per session: Not on file  . Stress: Not on file  Relationships  . Social Herbalist on phone: Not on file    Gets together: Not on file  Attends religious service: Not on file    Active member of club or organization: Not on file    Attends meetings of clubs or organizations: Not on file    Relationship status: Not on file  Other Topics Concern  . Not on file  Social History Narrative  . Not on file    Has this patient used any form of tobacco in the last 30 days? (Cigarettes, Smokeless Tobacco, Cigars, and/or Pipes) Prescription not provided because: Patient does not use tabocco or tobacco products  Current Medications: Current Facility-Administered Medications  Medication Dose Route  Frequency Provider Last Rate Last Dose  . acetaminophen (TYLENOL) tablet 650 mg  650 mg Oral Q4H PRN Mesner, Corene Cornea, MD      . alum & mag hydroxide-simeth (MAALOX/MYLANTA) 200-200-20 MG/5ML suspension 30 mL  30 mL Oral Q6H PRN Mesner, Corene Cornea, MD      . nicotine (NICODERM CQ - dosed in mg/24 hours) patch 21 mg  21 mg Transdermal Daily Mesner, Corene Cornea, MD      . ondansetron (ZOFRAN) tablet 4 mg  4 mg Oral Q8H PRN Mesner, Corene Cornea, MD       Current Outpatient Medications  Medication Sig Dispense Refill  . glucose monitoring kit (FREESTYLE) monitoring kit 1 each by Does not apply route as needed for other. 1 each 0   PTA Medications: (Not in a hospital admission)   Musculoskeletal: Strength & Muscle Tone: within normal limits Gait & Station: normal Patient leans: N/A  Psychiatric Specialty Exam: Physical Exam  Nursing note and vitals reviewed. Constitutional: She is oriented to person, place, and time. No distress.  Neck: Normal range of motion.  Respiratory: Effort normal.  Musculoskeletal: Normal range of motion.  Neurological: She is alert and oriented to person, place, and time.  Psychiatric: Her affect is labile. She is agitated. Thought content is not paranoid. Cognition and memory are normal. She expresses impulsivity. She expresses no homicidal and no suicidal ideation.    Review of Systems  Psychiatric/Behavioral: Positive for depression. Negative for hallucinations. Substance abuse: Denies. Suicidal ideas: Not stating that she was suicidal. The patient is nervous/anxious.        Stating that she does not know what is wrong.  "I just know how I feel. I don't know why"  All other systems reviewed and are negative.   Blood pressure 117/66, pulse 78, temperature 98.6 F (37 C), temperature source Oral, resp. rate 16, height '5\' 7"'$  (1.702 m), weight 75.3 kg, SpO2 99 %.Body mass index is 26 kg/m.  General Appearance: Casual  Eye Contact:  closing eyes like sleeping  Speech:  Clear and  Coherent and Normal Rate  Volume:  Normal  Mood:  Irritable  Affect:  Congruent  Thought Process:  Coherent and Descriptions of Associations: Intact  Orientation:  Full (Time, Place, and Person)  Thought Content:  WDL  Suicidal Thoughts:  No  Homicidal Thoughts:  No  Memory:  Immediate;   Fair Recent;   Fair Remote;   Fair  Judgement:  Intact  Insight:  Fair  Psychomotor Activity:  Normal  Concentration:  Concentration: Good and Attention Span: Good  Recall:  [place] of Knowledge:  Good  Language:  Good  Akathisia:  No  Handed:  Right  AIMS (if indicated):   N/A  Assets:  Communication Skills Desire for Improvement Physical Health  ADL's:  Intact  Cognition:  WNL  Sleep:   N/A     Demographic Factors:  Low socioeconomic status  and Unemployed  Loss Factors: homelessness  Historical Factors: NA  Risk Reduction Factors:   Religious beliefs about death  Continued Clinical Symptoms:  Previous Psychiatric Diagnoses and Treatments  Cognitive Features That Contribute To Risk:  None    Suicide Risk:  Minimal: No identifiable suicidal ideation.  Patients presenting with no risk factors but with morbid ruminations; may be classified as minimal risk based on the severity of the depressive symptoms    Plan Of Care/Follow-up recommendations:  Activity:  As tolerated Diet:  Heart healthy Other:  Follow up with resources given  Disposition: No evidence of imminent risk to self or others at present.   Patient does not meet criteria for psychiatric inpatient admission. Supportive therapy provided about ongoing stressors. Discussed crisis plan, support from social network, calling 911, coming to the Emergency Department, and calling Suicide Hotline.   Shuvon Rankin, NP 03/01/2019, 1:00 PM    Patient seen by telemedicine for psychiatric evaluation, chart reviewed and case discussed with the physician extender and developed treatment plan. Reviewed the information  documented and agree with the treatment plan.  Buford Dresser, DO 03/01/19 2:46 PM

## 2019-03-01 NOTE — ED Notes (Signed)
On admission to the Acute unit pt is calm and cooperative. She requested a shower and supplies were given to her for the shower.

## 2019-03-01 NOTE — ED Notes (Signed)
Pt escorted to exit by police. All belongings returned. Marland Kitchen
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
## 2019-03-01 NOTE — ED Provider Notes (Signed)
[facility] DEPT Provider Note   CSN: [id number] Arrival date & time: 02/28/19  2316     History   Chief Complaint Chief Complaint  Patient presents with   Suicidal    HPI Dana Noble is a 33 y.o. female.     I saw patient earlier in day at another facility where she apparently got mad and left because she couldn't get snacks. She was stating she wasn't suicidal on discharge and then supposedly tried to walk into traffic to attempt suicide and thus was brought here.   The history is provided by the patient and medical records.  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 No pertinent surgical history.   OB History   No obstetric history on file.      Home Medications    Prior to Admission medications   Medication Sig Start Date End Date Taking? Authorizing Provider  glucose monitoring kit (FREESTYLE) monitoring kit 1 each by Does not apply route as needed for other. 02/26/19  Yes Rancour, Annie Main, MD    Family History History reviewed. No pertinent family history.  Social History Social History   Tobacco Use   Smoking status: Never Smoker   Smokeless tobacco: Never Used  Substance Use Topics   Alcohol use: Not Currently   Drug use: Not Currently     Allergies   Patient has no known allergies.   Review of Systems Review of Systems  All other systems reviewed and are negative.    Physical Exam Updated Vital Signs BP 124/78 (BP Location: Right Arm)    Pulse 100    Temp 98.6  F (37 C) (Oral)    Resp 18    Ht _0  (1.702 m)    Wt 75.3 kg    LMP  (LMP Unknown)    SpO2 100%    BMI 26.00 kg/m   Physical Exam Vitals signs and nursing note reviewed.  Constitutional:      Appearance: She is well-developed.  HENT:     Head: Normocephalic and atraumatic.     Mouth/Throat:     Mouth: Mucous membranes are dry.     Pharynx: Oropharynx is clear.  Eyes:     Extraocular Movements: Extraocular movements intact.     Conjunctiva/sclera: Conjunctivae normal.  Neck:     Musculoskeletal: Normal range of motion.  Cardiovascular:     Rate and Rhythm: Normal rate and regular rhythm.  Pulmonary:     Effort: Pulmonary effort is normal. No respiratory distress.     Breath sounds: No stridor.  Abdominal:     General: There is no distension.     Palpations: There is no mass.  Musculoskeletal: Normal range of motion.        General: No swelling or tenderness.  Skin:    General: Skin is warm and dry.  Neurological:     General: No focal deficit present.     Mental Status: She is alert.      ED Treatments / Results  Labs (all labs ordered are listed, but only abnormal  results are displayed) Labs Reviewed  I-STAT BETA HCG BLOOD, ED ([facility], [facility], [facility] ONLY)    EKG None  Radiology No results found.  Procedures Procedures (including critical care time)  Medications Ordered in ED Medications - No data to display   Initial Impression / Assessment and Plan / ED Course  I have reviewed the triage vital signs and the nursing notes.  Pertinent labs & imaging results that were available during my care of the patient were reviewed by me and considered in my medical decision making (see chart for details).        Medically cleared earlier in day and basically came straight here. TTS to reeval in AM.   Final Clinical Impressions(s) / ED Diagnoses   Final diagnoses:  None    ED Discharge Orders    None       Tanicia Wolaver, Corene Cornea, MD 03/01/19 [phone]

## 2019-04-21 ENCOUNTER — Encounter (HOSPITAL_COMMUNITY): Payer: Self-pay | Admitting: Emergency Medicine

## 2019-04-21 DIAGNOSIS — M79672 Pain in left foot: Secondary | ICD-10-CM | POA: Insufficient documentation

## 2019-04-21 DIAGNOSIS — Z590 Homelessness: Secondary | ICD-10-CM | POA: Diagnosis not present

## 2019-04-21 DIAGNOSIS — Z5321 Procedure and treatment not carried out due to patient leaving prior to being seen by health care provider: Secondary | ICD-10-CM | POA: Insufficient documentation

## 2019-04-21 DIAGNOSIS — M79671 Pain in right foot: Secondary | ICD-10-CM | POA: Diagnosis present

## 2019-04-21 NOTE — ED Notes (Signed)
Pt given set of scubs, socks, no-rinse soap, mesh panties, sanitary napkins, and washcloths per her request

## 2019-04-21 NOTE — ED Triage Notes (Signed)
Patient here via EMS homeless with complaints of bilateral foot pain. Hx of diabetes.

## 2019-04-22 ENCOUNTER — Emergency Department (HOSPITAL_COMMUNITY)
Admission: EM | Admit: 2019-04-22 | Discharge: 2019-04-22 | Discharge status: Against Medical Advice | Payer: Medicare Other | Attending: Emergency Medicine | Admitting: Emergency Medicine

## 2019-04-25 ENCOUNTER — Other Ambulatory Visit: Payer: Self-pay

## 2019-04-25 ENCOUNTER — Encounter (HOSPITAL_COMMUNITY): Payer: Self-pay

## 2019-04-25 ENCOUNTER — Emergency Department (HOSPITAL_COMMUNITY)
Admission: EM | Admit: 2019-04-25 | Discharge: 2019-04-25 | Disposition: A | Discharge status: Home / Self Care | Payer: Medicare Other | Attending: Emergency Medicine | Admitting: Emergency Medicine

## 2019-04-25 DIAGNOSIS — Z5321 Procedure and treatment not carried out due to patient leaving prior to being seen by health care provider: Secondary | ICD-10-CM | POA: Insufficient documentation

## 2019-04-25 DIAGNOSIS — M25579 Pain in unspecified ankle and joints of unspecified foot: Secondary | ICD-10-CM | POA: Insufficient documentation

## 2019-04-25 LAB — CBG MONITORING, ED: Glucose-Capillary: 351 mg/dL — ABNORMAL HIGH (ref 70–99)

## 2019-04-25 MED ORDER — SODIUM CHLORIDE 0.9 % IV BOLUS: 1000.0000 mL | Freq: Once | INTRAVENOUS | Status: DC

## 2019-04-25 NOTE — ED Notes (Signed)
Pt left the ED with blanket.

## 2019-04-25 NOTE — ED Notes (Signed)
Pt at window asking for a blanket because she has to sleep outside. Pt continuing to get louder stating she doesn't want to check in just to have somewhere to sleep. Pt checked in.

## 2019-04-25 NOTE — ED Notes (Signed)
Pt not visualized in lobby or outside of ED.

## 2019-04-25 NOTE — ED Notes (Signed)
Registration states that patient is now back in lobby.

## 2019-04-25 NOTE — ED Triage Notes (Signed)
Pt states that she is homeless and has not been able to eat a healthy diet. She is requesting to have her sugar checked. Pt also states that due to sleeping outside, her body is sore in the mornings. She denies cough, fever, or SOB. Requesting resources for food assistance and day shelters upon d/c.

## 2019-04-25 NOTE — ED Notes (Signed)
Pt not in the lobby, women's bathroom, or out in front of the ED when called for a room.

## 2019-04-25 NOTE — ED Notes (Signed)
Called for the third time to triage. No answer and no patient seen in the lobby.

## 2019-04-27 ENCOUNTER — Other Ambulatory Visit: Payer: Self-pay

## 2019-04-27 ENCOUNTER — Encounter (HOSPITAL_COMMUNITY): Payer: Self-pay | Admitting: Emergency Medicine

## 2019-04-27 ENCOUNTER — Emergency Department (HOSPITAL_COMMUNITY)
Admission: EM | Admit: 2019-04-27 | Discharge: 2019-04-27 | Disposition: A | Discharge status: Home / Self Care | Payer: Medicare Other | Attending: Emergency Medicine | Admitting: Emergency Medicine

## 2019-04-27 DIAGNOSIS — Z5900 Homelessness unspecified: Secondary | ICD-10-CM

## 2019-04-27 DIAGNOSIS — Z91199 Patient's noncompliance with other medical treatment and regimen due to unspecified reason: Secondary | ICD-10-CM

## 2019-04-27 DIAGNOSIS — R739 Hyperglycemia, unspecified: Secondary | ICD-10-CM

## 2019-04-27 DIAGNOSIS — E1165 Type 2 diabetes mellitus with hyperglycemia: Secondary | ICD-10-CM | POA: Insufficient documentation

## 2019-04-27 DIAGNOSIS — Z9119 Patient's noncompliance with other medical treatment and regimen: Secondary | ICD-10-CM

## 2019-04-27 LAB — CBG MONITORING, ED: Glucose-Capillary: 329 mg/dL — ABNORMAL HIGH (ref 70–99)

## 2019-04-27 MED ORDER — INSULIN ASPART 100 UNIT/ML ~~LOC~~ SOLN
2.0000 [IU] | SUBCUTANEOUS | Status: AC
  Administered 2019-04-27: 04:00:00 2 [IU] via SUBCUTANEOUS
  Filled 2019-04-27: qty 0.02

## 2019-04-27 MED ORDER — METFORMIN HCL 500 MG PO TABS: 500.0000 mg | ORAL_TABLET | Freq: Two times a day (BID) | ORAL | 0 refills | Status: DC

## 2019-04-27 NOTE — ED Notes (Signed)
Pt called multiple times and did not answer when called because she had in her headphones

## 2019-04-27 NOTE — ED Provider Notes (Signed)
[facility] DEPT Provider Note   CSN: [id number] Arrival date & time: 04/27/19  0208     History   Chief Complaint Chief Complaint  Patient presents with  . Hyperglycemia    HPI Dana Noble is a 33 y.o. female.     The history is provided by the patient.  Hyperglycemia Blood sugar level PTA:  400s Severity:  Moderate Onset quality:  Gradual Timing:  Constant Progression:  Unchanged Chronicity:  Chronic Diabetes status:  Controlled with insulin Current diabetic therapy:  Not using anything Context: noncompliance   Relieved by:  Nothing Ineffective treatments:  None tried Associated symptoms: no abdominal pain, no altered mental status, no blurred vision, no chest pain, no confusion, no dehydration, no diaphoresis, no dizziness, no dysuria, no fatigue, no fever, no increased appetite, no increased thirst, no malaise, no nausea, no polyuria, no shortness of breath, no syncope, no vomiting, no weakness and no weight change   Risk factors: obesity     Past Medical History:  Diagnosis Date  . Diabetes mellitus without complication [facility])     Patient Active Problem List   Diagnosis Date Noted  . Malingering 03/01/2019  . Adjustment disorder with mixed disturbance of emotions and conduct   . 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 No pertinent surgical history.   OB History   No obstetric history on file.      Home Medications    Prior to Admission medications   Medication Sig Start Date End Date Taking? Authorizing Provider  glucose monitoring kit (FREESTYLE) monitoring kit 1 each by Does not apply route as needed for other. 02/26/19   Ezequiel Essex, MD    Family History History reviewed. No pertinent family history.  Social History Social History   Tobacco Use  . Smoking status: Never Smoker  . Smokeless tobacco: Never Used  Substance Use Topics  . Alcohol use: Not Currently  . Drug  use: Not Currently     Allergies   Patient has no known allergies.   Review of Systems Review of Systems  Constitutional: Negative for diaphoresis, fatigue and fever.  HENT: Negative for congestion.   Eyes: Negative for blurred vision and visual disturbance.  Respiratory: Negative for shortness of breath.   Cardiovascular: Negative for chest pain and syncope.  Gastrointestinal: Negative for abdominal pain, nausea and vomiting.  Endocrine: Negative for polydipsia and polyuria.  Genitourinary: Negative for dysuria.  Musculoskeletal: Negative for arthralgias.  Skin: Negative for color change.  Neurological: Negative for dizziness and weakness.  Psychiatric/Behavioral: Negative for confusion.  All other systems reviewed and are negative.    Physical Exam Updated Vital Signs There were no vitals taken for this visit.  Physical Exam Vitals signs and nursing note reviewed.  Constitutional:      General: She is not in acute distress.    Appearance: She is obese.  HENT:     Head: Normocephalic and atraumatic.     Nose: Nose normal.  Eyes:     Conjunctiva/sclera: Conjunctivae normal.     Pupils: Pupils are equal, round, and reactive to light.  Neck:     Musculoskeletal: Normal range of motion and neck supple.  Cardiovascular:     Rate and Rhythm: Normal rate and regular rhythm.     Pulses: Normal pulses.     Heart sounds: Normal heart sounds.  Pulmonary:     Effort: Pulmonary effort is normal.     Breath sounds: Normal breath sounds.  Abdominal:     General: Abdomen is flat. Bowel sounds are normal.     Tenderness: There is no abdominal tenderness. There is no guarding.  Musculoskeletal: Normal range of motion.  Skin:    General: Skin is warm and dry.     Capillary Refill: Capillary refill takes less than 2 seconds.  Neurological:     General: No focal deficit present.     Mental Status: She is alert and oriented to person, place, and time.     Deep Tendon Reflexes:  Reflexes normal.  Psychiatric:        Mood and Affect: Mood normal.        Behavior: Behavior normal.      ED Treatments / Results  Labs (all labs ordered are listed, but only abnormal results are displayed) Labs Reviewed  CBG MONITORING, ED - Abnormal; Notable for the following components:      Result Value   Glucose-Capillary 329 (*)    All other components within normal limits    EKG None  Radiology No results found.  Procedures Procedures (including critical care time)  Medications Ordered in ED Medications  insulin aspart (novoLOG) injection 2 Units (has no administration in time range)     Patient is really here because she wants a shower and a place to sleep.  She is asymptomatic.  Will start metformin and refer to community health and wellness.   Dana Noble was evaluated in Emergency Department on 04/27/2019 for the symptoms described in the history of present illness. She was evaluated in the context of the global COVID-19 pandemic, which necessitated consideration that the patient might be at risk for infection with the SARS-CoV-2 virus that causes COVID-19. Institutional protocols and algorithms that pertain to the evaluation of patients at risk for COVID-19 are in a state of rapid change based on information released by regulatory bodies including the CDC and federal and state organizations. These policies and algorithms were followed during the patient's care in the ED.   Final Clinical Impressions(s) / ED Diagnoses   Return for intractable cough, coughing up blood,fevers >100.4 unrelieved by medication, shortness of breath, intractable vomiting, chest pain, shortness of breath, weakness,numbness, changes in speech, facial asymmetry,abdominal pain, passing out,Inability to tolerate liquids or food, cough, altered mental status or any concerns. No signs of systemic illness or infection. The patient is nontoxic-appearing on exam and vital signs are  within normal limits.   I have reviewed the triage vital signs and the nursing notes. Pertinent labs &imaging results that were available during my care of the patient were reviewed by me and considered in my medical decision making (see chart for details). After history, exam, and medical workup I feel the patient has beenappropriately medically screened and is safe for discharge home. Pertinent diagnoses were discussed with the patient. Patient was givenreturn precautions   Merida Alcantar, MD 04/27/19 [phone]

## 2019-04-27 NOTE — ED Notes (Signed)
Pt started scream at nurse, pt d/c out and security called

## 2019-04-27 NOTE — ED Notes (Signed)
Pt left w/o v/s

## 2019-04-27 NOTE — ED Triage Notes (Signed)
Pt reports being homeless and diabetic. CBG was 329 and pt reports feeling dehydrated.

## 2019-10-08 ENCOUNTER — Emergency Department (HOSPITAL_COMMUNITY)
Admission: EM | Admit: 2019-10-08 | Discharge: 2019-10-09 | Disposition: A | Discharge status: Home / Self Care | Payer: Medicare Other | Attending: Emergency Medicine | Admitting: Emergency Medicine

## 2019-10-08 ENCOUNTER — Other Ambulatory Visit: Payer: Self-pay

## 2019-10-08 DIAGNOSIS — F314 Bipolar disorder, current episode depressed, severe, without psychotic features: Secondary | ICD-10-CM | POA: Diagnosis not present

## 2019-10-08 DIAGNOSIS — F1490 Cocaine use, unspecified, uncomplicated: Secondary | ICD-10-CM | POA: Diagnosis not present

## 2019-10-08 DIAGNOSIS — F99 Mental disorder, not otherwise specified: Secondary | ICD-10-CM | POA: Diagnosis present

## 2019-10-08 DIAGNOSIS — Z3201 Encounter for pregnancy test, result positive: Secondary | ICD-10-CM | POA: Insufficient documentation

## 2019-10-08 DIAGNOSIS — Z590 Homelessness: Secondary | ICD-10-CM | POA: Insufficient documentation

## 2019-10-08 DIAGNOSIS — Z7984 Long term (current) use of oral hypoglycemic drugs: Secondary | ICD-10-CM | POA: Insufficient documentation

## 2019-10-08 DIAGNOSIS — F1910 Other psychoactive substance abuse, uncomplicated: Secondary | ICD-10-CM

## 2019-10-08 DIAGNOSIS — R45851 Suicidal ideations: Secondary | ICD-10-CM | POA: Insufficient documentation

## 2019-10-08 DIAGNOSIS — E119 Type 2 diabetes mellitus without complications: Secondary | ICD-10-CM | POA: Insufficient documentation

## 2019-10-08 LAB — I-STAT BETA HCG BLOOD, ED (MC, WL, AP ONLY): I-stat hCG, quantitative: 63.6 m[IU]/mL — ABNORMAL HIGH (ref <5)

## 2019-10-08 LAB — CBC
HCT: 42.6 % (ref 36.0–46.0)
Hemoglobin: 13.4 g/dL (ref 12.0–15.0)
MCH: 27.2 pg (ref 26.0–34.0)
MCHC: 31.5 g/dL (ref 30.0–36.0)
MCV: 86.4 fL (ref 80.0–100.0)
Platelets: 452 10*3/uL — ABNORMAL HIGH (ref 150–400)
RBC: 4.93 MIL/uL (ref 3.87–5.11)
RDW: 13.5 % (ref 11.5–15.5)
WBC: 11 10*3/uL — ABNORMAL HIGH (ref 4.0–10.5)
nRBC: 0 % (ref 0.0–0.2)

## 2019-10-08 MED ORDER — ACETAMINOPHEN 325 MG PO TABS: 650.0000 mg | ORAL_TABLET | ORAL | Status: DC | PRN

## 2019-10-08 NOTE — ED Notes (Signed)
Attempted to obtain labs from pt x1.

## 2019-10-08 NOTE — ED Notes (Signed)
Pt encouraged to provide a urine specimen but states she is unable to at this time.  Security called to wand pt again now that pt has changed into scrubs. Pt's belongings placed in cabinets at nurses station marked 16-18/Res A.

## 2019-10-08 NOTE — ED Notes (Signed)
This RN contacted phlebotomy to attempt to get labs on patient. Phlebotomist said they have a few patients ahead, but they will come once they are done.

## 2019-10-08 NOTE — ED Notes (Signed)
Patient changed into purple scrubs and all belongings placed into belongings bag.

## 2019-10-08 NOTE — ED Triage Notes (Addendum)
[place]. Patient stated to police she feels like she wants to kill herself and is paranoid.

## 2019-10-08 NOTE — ED Provider Notes (Signed)
[facility] DEPT Provider Note   CSN: [id number] Arrival date & time: 10/08/19  2033     History Chief Complaint  Patient presents with  . Suicidal    Dana Noble is a 34 y.o. female.  HPI Patient presents to the emergency department with suicidal thoughts.  Patient states she been using drugs today and feels like she will not hurt her self and she become very paranoid.  Patient states that her suicidal thoughts have been getting worse as the day has gone on.  Patient does not have a specific plan to harm herself.  The patient denies chest pain, shortness of breath, headache,blurred vision, neck pain, fever, cough, weakness, numbness, dizziness, anorexia, edema, abdominal pain, nausea, vomiting, diarrhea, rash, back pain, dysuria, hematemesis, bloody stool, near syncope, or syncope.    Past Medical History:  Diagnosis Date  . Diabetes mellitus without complication [facility])     Patient Active Problem List   Diagnosis Date Noted  . Malingering 03/01/2019  . Adjustment disorder with mixed disturbance of emotions and conduct   . 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   OB History   No obstetric history on file.     No family history on file.  Social History   Tobacco Use  . Smoking status: Never Smoker  . Smokeless tobacco: Never Used  Substance Use Topics  . Alcohol use: Not Currently  . Drug use: Not Currently    Home Medications Prior to Admission medications   Medication Sig Start Date End Date Taking? Authorizing Provider  glucose monitoring kit (FREESTYLE) monitoring kit 1 each by Does not apply route as needed for other. 02/26/19  Yes Rancour, Annie Main, MD  metFORMIN (GLUCOPHAGE) 500 MG tablet Take 1 tablet (500 mg total) by mouth 2 (two) times daily with a meal. 04/27/19   Palumbo, April, MD    Allergies    Patient has no known allergies.  Review of Systems   Review of Systems  All other systems negative except as documented in the HPI. All pertinent positives and negatives as reviewed in the HPI. Physical Exam Updated Vital Signs BP 133/89 (BP Location: Right Arm)   Pulse 85   Temp (!) 97 F (36.1 C) (Oral)   Resp 16   Ht _0  (1.702 m)   Wt 93 kg   SpO2 100%   BMI 32.11 kg/m   Physical Exam Vitals and nursing note reviewed.  Constitutional:      General: She is not in acute distress.    Appearance: She is well-developed.  HENT:     Head: Normocephalic and atraumatic.  Eyes:     Pupils: Pupils are equal, round, and reactive to light.  Cardiovascular:     Rate and Rhythm: Normal rate and regular rhythm.     Heart sounds: Normal heart sounds. No murmur. No friction rub. No gallop.   Pulmonary:     Effort: Pulmonary effort is normal. No respiratory distress.     Breath sounds: Normal breath sounds. No wheezing.  Musculoskeletal:     Cervical back: Normal range of motion and neck supple.  Skin:    General: Skin is warm and dry.     Capillary Refill: Capillary refill takes less than 2 seconds.     Findings: No erythema or rash.  Neurological:     Mental Status: She is alert and oriented to person, place, and time.     Motor:  No abnormal muscle tone.     Coordination: Coordination normal.  Psychiatric:        Behavior: Behavior normal.        Thought Content: Thought content is paranoid. Thought content is not delusional. Thought content includes suicidal ideation. Thought content does not include homicidal ideation. Thought content does not include homicidal or suicidal plan.     ED Results / Procedures / Treatments   Labs (all labs ordered are listed, but only abnormal results are displayed) Labs Reviewed  COMPREHENSIVE METABOLIC PANEL  ETHANOL  SALICYLATE LEVEL  ACETAMINOPHEN LEVEL  CBC  RAPID URINE DRUG SCREEN, HOSP PERFORMED  I-STAT BETA HCG BLOOD, ED ([facility], [facility], [facility] ONLY)    EKG None  Radiology No results found.  Procedures  Procedures (including critical care time)  Medications Ordered in ED Medications - No data to display  ED Course  I have reviewed the triage vital signs and the nursing notes.  Pertinent labs & imaging results that were available during my care of the patient were reviewed by me and considered in my medical decision making (see chart for details).    MDM Rules/Calculators/A&P                      We will need TTS assessment for suicidal thoughts.  Patient has thus far been stable.  We will await her laboratory testing. Final Clinical Impression(s) / ED Diagnoses Final diagnoses:  None    Rx / DC Orders ED Discharge Orders    None       Rebeca Allegra 10/08/19 2217    Hayden Rasmussen, MD 10/09/19 1032

## 2019-10-08 NOTE — ED Notes (Signed)
This RN attempted to obtain labs x2. Joy, EMT also attempted. Pt requesting a break from being stuck. Will contact phlebotomy.

## 2019-10-09 ENCOUNTER — Emergency Department (HOSPITAL_COMMUNITY)
Admission: EM | Admit: 2019-10-09 | Discharge: 2019-10-10 | Disposition: A | Discharge status: Home / Self Care | Payer: Medicare Other | Source: Home / Self Care | Attending: Emergency Medicine | Admitting: Emergency Medicine

## 2019-10-09 ENCOUNTER — Encounter (HOSPITAL_COMMUNITY): Payer: Self-pay

## 2019-10-09 DIAGNOSIS — F331 Major depressive disorder, recurrent, moderate: Secondary | ICD-10-CM | POA: Insufficient documentation

## 2019-10-09 DIAGNOSIS — R45851 Suicidal ideations: Secondary | ICD-10-CM | POA: Insufficient documentation

## 2019-10-09 DIAGNOSIS — Z590 Homelessness: Secondary | ICD-10-CM | POA: Insufficient documentation

## 2019-10-09 DIAGNOSIS — Z008 Encounter for other general examination: Secondary | ICD-10-CM | POA: Insufficient documentation

## 2019-10-09 DIAGNOSIS — F329 Major depressive disorder, single episode, unspecified: Secondary | ICD-10-CM

## 2019-10-09 DIAGNOSIS — Z7984 Long term (current) use of oral hypoglycemic drugs: Secondary | ICD-10-CM | POA: Insufficient documentation

## 2019-10-09 DIAGNOSIS — O24119 Pre-existing diabetes mellitus, type 2, in pregnancy, unspecified trimester: Secondary | ICD-10-CM | POA: Insufficient documentation

## 2019-10-09 DIAGNOSIS — O99340 Other mental disorders complicating pregnancy, unspecified trimester: Secondary | ICD-10-CM | POA: Insufficient documentation

## 2019-10-09 DIAGNOSIS — Z3490 Encounter for supervision of normal pregnancy, unspecified, unspecified trimester: Secondary | ICD-10-CM

## 2019-10-09 DIAGNOSIS — F32A Depression, unspecified: Secondary | ICD-10-CM

## 2019-10-09 LAB — HCG, QUANTITATIVE, PREGNANCY: hCG, Beta Chain, Quant, S: 60 m[IU]/mL — ABNORMAL HIGH (ref <5)

## 2019-10-09 LAB — BASIC METABOLIC PANEL
Anion gap: 12 (ref 5–15)
BUN: 13 mg/dL (ref 6–20)
CO2: 25 mmol/L (ref 22–32)
Calcium: 9.3 mg/dL (ref 8.9–10.3)
Chloride: 99 mmol/L (ref 98–111)
Creatinine, Ser: 0.47 mg/dL (ref 0.44–1.00)
GFR calc Af Amer: >60 mL/min (ref >60)
GFR calc non Af Amer: >60 mL/min (ref >60)
Glucose, Bld: 256 mg/dL — ABNORMAL HIGH (ref 70–99)
Potassium: 3.9 mmol/L (ref 3.5–5.1)
Sodium: 136 mmol/L (ref 135–145)

## 2019-10-09 LAB — COMPREHENSIVE METABOLIC PANEL
ALT: 15 U/L (ref 0–44)
AST: 24 U/L (ref 15–41)
Albumin: 3.7 g/dL (ref 3.5–5.0)
Alkaline Phosphatase: 63 U/L (ref 38–126)
Anion gap: 10 (ref 5–15)
BUN: 8 mg/dL (ref 6–20)
CO2: 24 mmol/L (ref 22–32)
Calcium: 8.9 mg/dL (ref 8.9–10.3)
Chloride: 102 mmol/L (ref 98–111)
Creatinine, Ser: 0.51 mg/dL (ref 0.44–1.00)
GFR calc Af Amer: >60 mL/min (ref >60)
GFR calc non Af Amer: >60 mL/min (ref >60)
Glucose, Bld: 291 mg/dL — ABNORMAL HIGH (ref 70–99)
Potassium: 3.8 mmol/L (ref 3.5–5.1)
Sodium: 136 mmol/L (ref 135–145)
Total Bilirubin: 0.5 mg/dL (ref 0.3–1.2)
Total Protein: 7.5 g/dL (ref 6.5–8.1)

## 2019-10-09 LAB — RAPID URINE DRUG SCREEN, HOSP PERFORMED
Amphetamines: NOT DETECTED
Barbiturates: NOT DETECTED
Benzodiazepines: NOT DETECTED
Cocaine: NOT DETECTED
Opiates: NOT DETECTED
Tetrahydrocannabinol: NOT DETECTED

## 2019-10-09 LAB — ACETAMINOPHEN LEVEL: Acetaminophen (Tylenol), Serum: <10 ug/mL — ABNORMAL LOW (ref 10–30)

## 2019-10-09 LAB — ETHANOL: Alcohol, Ethyl (B): <10 mg/dL (ref <10)

## 2019-10-09 LAB — CBG MONITORING, ED: Glucose-Capillary: 247 mg/dL — ABNORMAL HIGH (ref 70–99)

## 2019-10-09 LAB — SALICYLATE LEVEL: Salicylate Lvl: <7 mg/dL — ABNORMAL LOW (ref 7.0–30.0)

## 2019-10-09 NOTE — Progress Notes (Signed)
Per Nira Conn, NP pt does not meet criteria for inpt tx. TTS faxed resources including MH tx and homeless shelter resources to the pt. EDP Fayrene Helper, PA-C and [place], Junior Caryn Section, RN have been advised.

## 2019-10-09 NOTE — ED Provider Notes (Signed)
Emergency Medicine Observation Re-evaluation Note  Dana Noble is a 34 y.o. female, seen on rounds today.  Pt initially presented to the ED for complaints of Suicidal Currently, the patient is alert, coloring, and cooperative.  She states that she came in last night because she was cold and needed something to eat, and knows that she can get those needs taken care of in the emergency department.  She states that she was suicidal, when she came in but is no longer suicidal and does not have a suicidal plan.  She states that she wants to go stay with her aunt who lives in [place], [place].  She denies any concurrent illnesses or problems.  She admits to homelessness.  I discussed patient's change in status, with TTS, who will reassess her with psychiatry this morning  Physical Exam  BP 123/77 (BP Location: Left Arm)   Pulse 87   Temp 98.7 F (37.1 C) (Oral)   Resp 17   Ht 5\' 7"  (1.702 m)   Wt 93 kg   SpO2 97%   BMI 32.11 kg/m  Physical Exam   Alert, calm cooperative.  No dysarthria or aphasia.  She does not appear to be responding to internal stimuli.  ED Course / MDM  EKG:     The patient has been placed in psychiatric observation due to the need to provide a safe environment for the patient while obtaining psychiatric consultation and evaluation, as well as ongoing medical and medication management to treat the patient's condition.  The patient has not been placed under full IVC at this time.  I have reviewed the labs performed to date as well as medications administered while in observation.  Recent changes in the last 24 hours include patient is no longer suicidal.. Plan  Current plan is for TTS reassessment. Patient is not under full IVC at this time.   , MD 10/09/19 1010

## 2019-10-09 NOTE — BH Assessment (Signed)
[facility] Assessment Progress Note   Case was staffed with Arvilla Market NP who recommends patient be discharged.

## 2019-10-09 NOTE — ED Triage Notes (Signed)
[place] [facility] [place] She reports that she is suicidal, hallucinating, and paranoid. She was seen last night and released from this facility this morning. She denies any plan.

## 2019-10-09 NOTE — ED Notes (Signed)
Pt got out of hall bed and began walking towards an exit of the dept. Pt asked where she was going and informed staff she had to "pee right now". Pt redirected to the BR and asked to provide urine specimen.  While pt was in the BR, pt began slamming the cart in the BR into the wall. Writer asked pt to stop and asked again if she could provide a urine specimen. After pt provided urine specimen, pt used the soap in the BR to wash her face.  Pt now back in hall D and RN provided pt with food/drink.

## 2019-10-09 NOTE — ED Notes (Signed)
Spoke with lab about pt not needing the Covid test now because she is not symptomatic or being admitting. She is being discharged with resources.  There was not a place under "orders" to discontinue it. Lab staff said they would make it inactive. It had been collected in the tube that is for send out to [facility] and it should have been in the tube for [facility] lab, but then it was decided that she does not need it anyway.

## 2019-10-09 NOTE — [facility] Suicide Risk Assessment (Cosign Needed)
Suicide Risk Assessment  Discharge Assessment   [facility] Discharge Suicide Risk Assessment   Principal Problem: <principal problem not specified> Discharge Diagnoses: Active Problems:   * No active hospital problems. *   Total Time spent with patient: 30 minutes  Musculoskeletal: Strength & Muscle Tone: within normal limits Gait & Station: normal Patient leans: N/A  Psychiatric Specialty Exam:   Blood pressure 123/77, pulse 87, temperature 98.7 F (37.1 C), temperature source Oral, resp. rate 17, height 5\' 7"  (1.702 m), weight 93 kg, SpO2 97 %.Body mass index is 32.11 kg/m.  General Appearance: Casual and Fairly Groomed  ::  Good  Speech:  Clear and Coherent  Volume:  Normal  Mood:  Anxious and Euthymic  Affect:  Congruent  Thought Process:  Coherent, Goal Directed and Descriptions of Associations: Circumstantial  Orientation:  Full (Time, Place, and Person)  Thought Content:  Rumination  Suicidal Thoughts:  No  Homicidal Thoughts:  No  Memory:  Immediate;   Good Recent;   Good Remote;   Good  Judgement:  Intact  Insight:  Fair  Psychomotor Activity:  Increased  Concentration:  Good  Recall:  Good  Fund of Knowledge:Fair  Language: Good  Akathisia:  Negative  Handed:  Right  AIMS (if indicated):     Assets:  Desire for Improvement Resilience  Sleep:     Cognition: WNL  ADL's:  Intact   Mental Status Per Nursing Assessment::   On Admission:     Demographic Factors:  Low socioeconomic status and homeless  Loss Factors: homeless  Historical Factors: Impulsivity  Risk Reduction Factors:   Pregnancy and states she has a supportive aunt  Continued Clinical Symptoms:  Alcohol/Substance Abuse/Dependencies  Cognitive Features That Contribute To Risk:  None    Suicide Risk:  Mild:  Suicidal ideation of limited frequency, intensity, duration, and specificity.  There are no identifiable plans, no associated intent, mild dysphoria and related  symptoms, good self-control (both objective and subjective assessment), few other risk factors, and identifiable protective factors, including available and accessible social support.   Plan Of Care/Follow-up recommendations:  Chart reviewed and patient seen for face to face evaluation.  She validates most of the information obtained in [facility] assessment.  She endorses situational depressive mood with suicidal ideations in the setting of "cocaine use" and decreased sleep over the past 2 days.  Of note, UDS on 2/19 was negative.  Currently states after sleeping, she feels better, no longer endorses suicidal ideations and does not have a plan for self harm.  She states she is homeless and was most concerned about having food and a place to sleep last night.  SW provided resources for shelter. Peer support for reported substance abuse.  Patient reports she can stay with her aunt when discharged until shelter becomes available.       Activity:  as tolerated Diet:  as tolerated Tests:  as tolerated   Chart reviewed and patient seen for face to face evaluation.  Collateral information received by pa 3/19, NP 10/09/2019, 1:48 PM

## 2019-10-09 NOTE — ED Provider Notes (Signed)
  11:12 PM TTS have evaluated pt and felt pt does not meet inpt criteria.  They have provided outpt resources.  Her quantitative beta hCG is elevated at 60.  At this time, I feel patient is stable for discharge.  Encourage patient to follow-up with OB/GYN for further evaluation of her pregnancy.  BP (!) 125/95 (BP Location: Right Arm)   Pulse 86   Temp 98.5 F (36.9 C) (Oral)   Resp 18   Ht 5\' 7"  (1.702 m)   Wt 93 kg   SpO2 100%   BMI 32.11 kg/m   Results for orders placed or performed during the hospital encounter of 10/09/19  hCG, quantitative, pregnancy  Result Value Ref Range   hCG, Beta Chain, Quant, S 60 (H) <5 mIU/mL  Basic metabolic panel  Result Value Ref Range   Sodium 136 135 - 145 mmol/L   Potassium 3.9 3.5 - 5.1 mmol/L   Chloride 99 98 - 111 mmol/L   CO2 25 22 - 32 mmol/L   Glucose, Bld 256 (H) 70 - 99 mg/dL   BUN 13 6 - 20 mg/dL   Creatinine, Ser 10/11/19 0.44 - 1.00 mg/dL   Calcium 9.3 8.9 - 2.67 mg/dL   GFR calc non Af Amer >60 >60 mL/min   GFR calc Af Amer >60 >60 mL/min   Anion gap 12 5 - 15  POC CBG, ED  Result Value Ref Range   Glucose-Capillary 247 (H) 70 - 99 mg/dL   No results found.    12.4, PA-C 10/10/19 0445    10/12/19, MD 10/10/19 1213

## 2019-10-09 NOTE — ED Notes (Signed)
Patient requesting body wash and lotion, saying that she is unable to sleep because her skin feels dirty and itchy. Patient provided with these items, as well as, washcloths and clean purple scrubs. Patient is anxious, but is cooperating at the moment.
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
## 2019-10-09 NOTE — BH Assessment (Signed)
Tele Assessment Note   Patient Name: Dana Noble MRN: [id number] Referring Physician: Benjiman Core, MD Location of Patient: [place] Location of Provider: Behavioral Health TTS Department  Dana Noble is an 34 y.o. female who presents to the ED voluntarily. Pt reports she is homeless and needs somewhere to stay for the night because she is pregnant and has nowhere to go. Pt reports she wanted to stay in the ED "so I can get some peace of mind." Pt denies SI, HI, or AVH. [facility] this date for same and was d/c. Pt returned to the ED several hours later. Pt states she does not have any resources or support. Pt reports her family lives in [place], [place] but reports they are not supportive. Pt states the child's father is not involved in her pregnancy. Pt is vague and name has to be called multiple times in order for her to engage in the assessment.   Per Nira Conn, NP pt does not meet criteria for inpt tx. TTS faxed resources including MH tx and homeless shelter resources to the pt. EDP Fayrene Helper, PA-C and [place], Junior Caryn Section, RN have been advised.  Diagnosis: MDD, recurrent, moderate, w/o psychosis   Past Medical History:  Past Medical History:  Diagnosis Date  . Diabetes mellitus without complication ([facility])     History reviewed. No pertinent surgical history.  Family History: History reviewed. No pertinent family history.  Social History:  reports that she has never smoked. She has never used smokeless tobacco. She reports previous alcohol use. She reports previous drug use.  Additional Social History:  Alcohol / Drug Use Pain Medications: See MAR Prescriptions: See MAR Over the Counter: See MAR History of alcohol / drug use?: No history of alcohol / drug abuse  CIWA: CIWA-Ar BP: (!) 125/95 Pulse Rate: 86 COWS:    Allergies: No Known Allergies  Home Medications: (Not in a hospital admission)   OB/GYN Status:  No LMP recorded.  General  Assessment Data [facility] of [facility] TTS Assessment: In system Is this a Tele or Face-to-Face Assessment?: Tele Assessment Is this an Initial Assessment or a Re-assessment for this encounter?: Initial Assessment Patient Accompanied by:: N/A Language Other than English: No Living Arrangements: Homeless/Shelter What gender do you identify as?: Female Marital status: Single Pregnancy Status: Yes (Comment: include estimated delivery date)(pt says yes, UDS and labs pending) Living Arrangements: Alone Can pt return to current living arrangement?: Yes Admission Status: Voluntary Is patient capable of signing voluntary admission?: Yes Referral Source: Self/Family/Friend Insurance type: [facility]     Crisis Care Plan Living Arrangements: Alone Name of Psychiatrist: None Name of Therapist: None  Education Status Is patient currently in school?: No Is the patient employed, unemployed or receiving disability?: Unemployed  Risk to self with the past 6 months Suicidal Ideation: No Has patient been a risk to self within the past 6 months prior to admission? : No Suicidal Intent: No Has patient had any suicidal intent within the past 6 months prior to admission? : No Is patient at risk for suicide?: No Suicidal Plan?: No Has patient had any suicidal plan within the past 6 months prior to admission? : No Access to Means: No What has been your use of drugs/alcohol within the last 12 months?: denies Previous Attempts/Gestures: No Triggers for Past Attempts: None known Intentional Self Injurious Behavior: None Family Suicide History: No Recent stressful life event(s): Other (Comment), Financial Problems(homeless) Persecutory voices/beliefs?: No Depression: Yes  Depression Symptoms: Despondent Substance abuse history and/or treatment for substance abuse?: No Suicide prevention information given to non-admitted patients: Not applicable  Risk to Others within the past 6 months Homicidal  Ideation: No Does patient have any lifetime risk of violence toward others beyond the six months prior to admission? : No Thoughts of Harm to Others: No Current Homicidal Intent: No Current Homicidal Plan: No Access to Homicidal Means: No History of harm to others?: No Assessment of Violence: None Noted Does patient have access to weapons?: No Criminal Charges Pending?: No Does patient have a court date: No Is patient on probation?: No  Psychosis Hallucinations: None noted Delusions: None noted  Mental Status Report Appearance/Hygiene: In scrubs Eye Contact: Poor(eyes closed during the assessment) Motor Activity: Freedom of movement Speech: Logical/coherent Level of Consciousness: Drowsy Mood: Depressed Affect: Flat, Depressed Anxiety Level: None Thought Processes: Relevant, Coherent Judgement: Partial Orientation: Person, Place, Time, Situation, Appropriate for developmental age Obsessive Compulsive Thoughts/Behaviors: None  Cognitive Functioning Concentration: Normal Memory: Remote Intact, Recent Intact Is patient IDD: No Insight: Fair Impulse Control: Good Appetite: Good Have you had any weight changes? : No Change Sleep: No Change Total Hours of Sleep: 8 Vegetative Symptoms: None  ADLScreening [facility] Assessment Services) Patient's cognitive ability adequate to safely complete daily activities?: Yes Patient able to express need for assistance with ADLs?: Yes Independently performs ADLs?: Yes (appropriate for developmental age)  Prior Inpatient Therapy Prior Inpatient Therapy: Yes Prior Therapy Dates: 2019, 2018, 2017 Prior Therapy [facility]ilty[facility]Provider([facility]): [facility] Reason for Treatment: MH issues  Prior Outpatient Therapy Prior Outpatient Therapy: Yes Prior Therapy Dates: 2019 Prior Therapy Facilty[facility]Provider(s[facility] Reason for Treatment: MH issues Does patient have an ACCT team?: No Does patient have Intensive In-House Services?  : No Does  patient have [facility] services? : No Does patient have P4CC services?: No  ADL Screening (condition at time of admission) Patient's cognitive ability adequate to safely complete daily activities?: Yes Is the patient deaf or have difficulty hearing?: No Does the patient have difficulty seeing, even when wearing glasses/contacts?: No Does the patient have difficulty concentrating, remembering, or making decisions?: No Patient able to express need for assistance with ADLs?: Yes Does the patient have difficulty dressing or bathing?: No Independently performs ADLs?: Yes (appropriate for developmental age) Does the patient have difficulty walking or climbing stairs?: No Weakness of Legs: None Weakness of Arms/Hands: None  Home Assistive Devices/Equipment Home Assistive Devices/Equipment: None    Abuse/Neglect Assessment (Assessment to be complete while patient is alone) Abuse/Neglect Assessment Can Be Completed: Yes Physical Abuse: Denies Verbal Abuse: Denies Sexual Abuse: Denies Exploitation of patient/patient's resources: Denies Self-Neglect: Denies     Merchant navy officer (For Healthcare) Does Patient Have a Medical Advance Directive?: No Would patient like information on creating a medical advance directive?: No - Patient declined          Disposition: Per Nira Conn, NP pt does not meet criteria for inpt tx. TTS faxed resources including MH tx and homeless shelter resources to the pt. EDP Fayrene Helper, PA-C and [place], Junior Caryn Section, RN have been advised.   Disposition Initial Assessment Completed for this Encounter: Yes Disposition of Patient: Discharge Patient refused recommended treatment: No Mode of transportation if patient is discharged/movement?: Bus  This service was provided via telemedicine using a 2-way, interactive audio and video technology.  Names of all persons participating in this telemedicine service and their role in this encounter. Name:  Dana Noble Role: Patient  Name:  Lind Covert Role: TTS          Lyanne Co 10/09/2019 11:28 PM

## 2019-10-09 NOTE — ED Notes (Signed)
Pt ambulated to [facility]

## 2019-10-09 NOTE — Progress Notes (Signed)
TOC consult received regarding patient needing homeless and pregnancy resources. Patient reports she has been living on the streets. She reports she is originally from [place], but was unclear how she ended up here in [place]. Patient reports she is hopefully she can find a shelter here. Patient reports she has not had any prenatal care yet.   CSW contacted [facility] who reports all the shelters are full. CSW also contacted other shelters in the area directly and did not receive an answer from most. CSW provided patient with a list of shelter resources and encouraged her to follow up with making an appointment for prenantal care using the pregnancy resources provided. Patient accepted resources and reports she will make arrangements for somewhere safe to stay until a shelter becomes available.   Geralyn Corwin, LCSW Transitions of Care Department [facility] ED [phone]

## 2019-10-09 NOTE — ED Notes (Signed)
Pt discharged home. Discharged instructions read to pt who verbalized understanding. All belongings returned to pt who signed for same. Denies SI/HI, is not delusional and not responding to internal stimuli. Escorted pt to the ED exit.    

## 2019-10-09 NOTE — ED Provider Notes (Signed)
[place] DEPT Provider Note   CSN: [id number] Arrival date & time: 10/09/19  1912     History No chief complaint on file.   Dana Noble is a 34 y.o. female.  HPI Patient presents saying she is suicidal hallucinating and paranoid.  Seen yesterday.  States she was the positive for.  States she also needs just to be observed over the night.  She is homeless.  Seen yesterday for the same.  Reportedly is also pregnant states she is having trouble dealing with this.  Appears to had a positive pregnancy test yesterday but not on the ones before that.  Denies further substance abuse.  States she is having some suicidal thoughts.  States she hits herself in the head.    Past Medical History:  Diagnosis Date  . Diabetes mellitus without complication [facility])     Patient Active Problem List   Diagnosis Date Noted  . Malingering 03/01/2019  . Adjustment disorder with mixed disturbance of emotions and conduct   . 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 No pertinent surgical history.   OB History   No obstetric history on file.     History reviewed. No pertinent family history.  Social History   Tobacco Use  . Smoking status: Never Smoker  . Smokeless tobacco: Never Used  Substance Use Topics  . Alcohol use: Not Currently  . Drug use: Not Currently    Home Medications Prior to Admission medications   Medication Sig Start Date End Date Taking? Authorizing Provider  glucose monitoring kit (FREESTYLE) monitoring kit 1 each by Does not apply route as needed for other. 02/26/19   Rancour, Annie Main, MD  metFORMIN (GLUCOPHAGE) 500 MG tablet Take 1 tablet (500 mg total) by mouth 2 (two) times daily with a meal. 04/27/19   Palumbo, April, MD    Allergies    Patient has no known allergies.  Review of Systems   Review of Systems  Constitutional: Negative for appetite change.  HENT: Negative for congestion.   Respiratory:  Negative for shortness of breath.   Gastrointestinal: Negative for abdominal pain.  Genitourinary: Negative for flank pain.  Musculoskeletal: Negative for back pain.  Skin: Negative for rash.  Neurological: Negative for weakness.  Psychiatric/Behavioral: Positive for hallucinations and suicidal ideas. Negative for confusion.    Physical Exam Updated Vital Signs BP (!) 125/95 (BP Location: Right Arm)   Pulse 86   Temp 98.5 F (36.9 C) (Oral)   Resp 18   Ht '5\' 7"'$  (1.702 m)   Wt 93 kg   SpO2 100%   BMI 32.11 kg/m   Physical Exam Vitals reviewed.  Cardiovascular:     Rate and Rhythm: Regular rhythm.  Pulmonary:     Breath sounds: No wheezing or rhonchi.  Abdominal:     Tenderness: There is no abdominal tenderness.  Musculoskeletal:        General: No tenderness.  Skin:    General: Skin is warm.     Capillary Refill: Capillary refill takes less than 2 seconds.  Neurological:     Mental Status: She is alert. Mental status is at baseline.  Psychiatric:     Comments: Does not appear to be responding to internal stimuli.     ED Results / Procedures / Treatments   Labs (all labs ordered are listed, but only abnormal results are displayed) Labs Reviewed  HCG, QUANTITATIVE, PREGNANCY  CBG MONITORING, ED    EKG None  Radiology  No results found.  Procedures Procedures (including critical care time)  Medications Ordered in ED Medications - No data to display  ED Course  I have reviewed the triage vital signs and the nursing notes.  Pertinent labs & imaging results that were available during my care of the patient were reviewed by me and considered in my medical decision making (see chart for details).    MDM Rules/Calculators/A&P                     Patient brought in by police reportedly suicidal and paranoid.  States has been hitting herself in the head.  Also possibly pregnant.  I-STAT hCG was positive yesterday but will recheck due to somewhat unreliability  in that test.  Mild hyperglycemia.  Will check basic metabolic.  Labs reviewed from yesterday.  Patient does have psychiatric history but also malingering history.  Reviewing records was going to go stay with her sister since she is homeless.  Does not appear she has done that.  Patient could be malingering to have a place to stay, however does state that she is suicidal and actively hallucinating.  Will have patient seen by TTS.  Final Clinical Impression(s) / ED Diagnoses Final diagnoses:  None    Rx / DC Orders ED Discharge Orders    None       Davonna Belling, MD 10/09/19 2132

## 2019-10-09 NOTE — ED Notes (Signed)
Pt in shower after arrival on unit.   One bag of belongings placed in locker 35

## 2019-10-09 NOTE — ED Notes (Signed)
RN unable to obtain labs x2. Will call Phlebotomy.

## 2019-10-09 NOTE — ED Notes (Signed)
Pt now has sitter at bedside  

## 2019-10-09 NOTE — ED Notes (Signed)
Pt sitting in bed at this time yelling, but to herself. When pt is asked if she needs anything, pt denies needing anything and continues to yell to herself.

## 2019-10-09 NOTE — BH Assessment (Signed)
Clinician attempted to contact [facility] to request they put the Tele-Assessment machine in pt's room so pt's BH Assessment can be conducted. There was no answer on the phone, however, so clinician was unable to complete assessment at this time. TTS will attempt assessment at a later time.

## 2019-10-10 NOTE — Discharge Instructions (Signed)
Please use resources below to seek help for your mental illness.  Your pregnancy test is positive.  Please follow up with OBGYN for further evaluation.

## 2020-01-13 ENCOUNTER — Encounter (HOSPITAL_COMMUNITY): Payer: Self-pay

## 2020-01-13 ENCOUNTER — Emergency Department (HOSPITAL_COMMUNITY)
Admission: EM | Admit: 2020-01-13 | Discharge: 2020-01-13 | Discharge status: Against Medical Advice | Payer: Medicare Other | Attending: Emergency Medicine | Admitting: Emergency Medicine

## 2020-01-13 ENCOUNTER — Emergency Department (HOSPITAL_COMMUNITY)
Admission: EM | Admit: 2020-01-13 | Discharge: 2020-01-14 | Discharge status: Against Medical Advice | Payer: Medicare Other | Source: Home / Self Care | Attending: Emergency Medicine | Admitting: Emergency Medicine

## 2020-01-13 ENCOUNTER — Emergency Department (HOSPITAL_COMMUNITY): Payer: Medicare Other

## 2020-01-13 ENCOUNTER — Other Ambulatory Visit: Payer: Self-pay

## 2020-01-13 DIAGNOSIS — E1165 Type 2 diabetes mellitus with hyperglycemia: Secondary | ICD-10-CM | POA: Insufficient documentation

## 2020-01-13 DIAGNOSIS — Z590 Homelessness: Secondary | ICD-10-CM | POA: Insufficient documentation

## 2020-01-13 DIAGNOSIS — Z3A01 Less than 8 weeks gestation of pregnancy: Secondary | ICD-10-CM | POA: Diagnosis not present

## 2020-01-13 DIAGNOSIS — R509 Fever, unspecified: Secondary | ICD-10-CM

## 2020-01-13 DIAGNOSIS — Z20822 Contact with and (suspected) exposure to covid-19: Secondary | ICD-10-CM

## 2020-01-13 DIAGNOSIS — R112 Nausea with vomiting, unspecified: Secondary | ICD-10-CM

## 2020-01-13 DIAGNOSIS — A419 Sepsis, unspecified organism: Secondary | ICD-10-CM

## 2020-01-13 DIAGNOSIS — O26891 Other specified pregnancy related conditions, first trimester: Secondary | ICD-10-CM | POA: Diagnosis not present

## 2020-01-13 DIAGNOSIS — F458 Other somatoform disorders: Secondary | ICD-10-CM

## 2020-01-13 DIAGNOSIS — O24111 Pre-existing diabetes mellitus, type 2, in pregnancy, first trimester: Secondary | ICD-10-CM | POA: Insufficient documentation

## 2020-01-13 DIAGNOSIS — O219 Vomiting of pregnancy, unspecified: Secondary | ICD-10-CM | POA: Insufficient documentation

## 2020-01-13 DIAGNOSIS — Z5329 Procedure and treatment not carried out because of patient's decision for other reasons: Secondary | ICD-10-CM

## 2020-01-13 DIAGNOSIS — F1410 Cocaine abuse, uncomplicated: Secondary | ICD-10-CM

## 2020-01-13 DIAGNOSIS — N390 Urinary tract infection, site not specified: Secondary | ICD-10-CM

## 2020-01-13 DIAGNOSIS — R739 Hyperglycemia, unspecified: Secondary | ICD-10-CM

## 2020-01-13 LAB — COMPREHENSIVE METABOLIC PANEL
ALT: 14 U/L (ref 0–44)
ALT: 15 U/L (ref 0–44)
AST: 16 U/L (ref 15–41)
AST: 17 U/L (ref 15–41)
Albumin: 3.4 g/dL — ABNORMAL LOW (ref 3.5–5.0)
Albumin: 3.1 g/dL — ABNORMAL LOW (ref 3.5–5.0)
Alkaline Phosphatase: 56 U/L (ref 38–126)
Alkaline Phosphatase: 72 U/L (ref 38–126)
Anion gap: 11 (ref 5–15)
Anion gap: 8 (ref 5–15)
BUN: 10 mg/dL (ref 6–20)
BUN: 8 mg/dL (ref 6–20)
CO2: 20 mmol/L — ABNORMAL LOW (ref 22–32)
CO2: 25 mmol/L (ref 22–32)
Calcium: 8.3 mg/dL — ABNORMAL LOW (ref 8.9–10.3)
Calcium: 8.7 mg/dL — ABNORMAL LOW (ref 8.9–10.3)
Chloride: 98 mmol/L (ref 98–111)
Chloride: 99 mmol/L (ref 98–111)
Creatinine, Ser: 0.81 mg/dL (ref 0.44–1.00)
Creatinine, Ser: 0.83 mg/dL (ref 0.44–1.00)
GFR calc Af Amer: >60 mL/min (ref >60)
GFR calc Af Amer: >60 mL/min (ref >60)
GFR calc non Af Amer: >60 mL/min (ref >60)
GFR calc non Af Amer: >60 mL/min (ref >60)
Glucose, Bld: 451 mg/dL — ABNORMAL HIGH (ref 70–99)
Glucose, Bld: 434 mg/dL — ABNORMAL HIGH (ref 70–99)
Potassium: 3.5 mmol/L (ref 3.5–5.1)
Potassium: 3.9 mmol/L (ref 3.5–5.1)
Sodium: 129 mmol/L — ABNORMAL LOW (ref 135–145)
Sodium: 132 mmol/L — ABNORMAL LOW (ref 135–145)
Total Bilirubin: 0.4 mg/dL (ref 0.3–1.2)
Total Bilirubin: 0.3 mg/dL (ref 0.3–1.2)
Total Protein: 6.4 g/dL — ABNORMAL LOW (ref 6.5–8.1)
Total Protein: 6.8 g/dL (ref 6.5–8.1)

## 2020-01-13 LAB — CBC WITH DIFFERENTIAL/PLATELET
Abs Immature Granulocytes: 0.05 10*3/uL (ref 0.00–0.07)
Basophils Absolute: 0 10*3/uL (ref 0.0–0.1)
Basophils Relative: 0 %
Eosinophils Absolute: 0 10*3/uL (ref 0.0–0.5)
Eosinophils Relative: 0 %
HCT: 43.5 % (ref 36.0–46.0)
Hemoglobin: 14.3 g/dL (ref 12.0–15.0)
Immature Granulocytes: 1 %
Lymphocytes Relative: 8 %
Lymphs Abs: 0.8 10*3/uL (ref 0.7–4.0)
MCH: 27.4 pg (ref 26.0–34.0)
MCHC: 32.9 g/dL (ref 30.0–36.0)
MCV: 83.5 fL (ref 80.0–100.0)
Monocytes Absolute: 1.1 10*3/uL — ABNORMAL HIGH (ref 0.1–1.0)
Monocytes Relative: 11 %
Neutro Abs: 8 10*3/uL — ABNORMAL HIGH (ref 1.7–7.7)
Neutrophils Relative %: 80 %
Platelets: 229 10*3/uL (ref 150–400)
RBC: 5.21 MIL/uL — ABNORMAL HIGH (ref 3.87–5.11)
RDW: 13.3 % (ref 11.5–15.5)
WBC: 9.9 10*3/uL (ref 4.0–10.5)
nRBC: 0 % (ref 0.0–0.2)

## 2020-01-13 LAB — URINALYSIS, ROUTINE W REFLEX MICROSCOPIC
Bilirubin Urine: NEGATIVE
Glucose, UA: >=500 mg/dL — AB
Hgb urine dipstick: NEGATIVE
Ketones, ur: NEGATIVE mg/dL
Nitrite: NEGATIVE
Protein, ur: NEGATIVE mg/dL
Specific Gravity, Urine: 1.026 (ref 1.005–1.030)
WBC, UA: >50 WBC/hpf — ABNORMAL HIGH (ref 0–5)
pH: 6 (ref 5.0–8.0)

## 2020-01-13 LAB — CBC
HCT: 36.5 % (ref 36.0–46.0)
Hemoglobin: 11.8 g/dL — ABNORMAL LOW (ref 12.0–15.0)
MCH: 27.8 pg (ref 26.0–34.0)
MCHC: 32.3 g/dL (ref 30.0–36.0)
MCV: 85.9 fL (ref 80.0–100.0)
Platelets: 303 10*3/uL (ref 150–400)
RBC: 4.25 MIL/uL (ref 3.87–5.11)
RDW: 13.6 % (ref 11.5–15.5)
WBC: 15 10*3/uL — ABNORMAL HIGH (ref 4.0–10.5)
nRBC: 0 % (ref 0.0–0.2)

## 2020-01-13 LAB — CBG MONITORING, ED: Glucose-Capillary: 399 mg/dL — ABNORMAL HIGH (ref 70–99)

## 2020-01-13 LAB — LACTIC ACID, PLASMA: Lactic Acid, Venous: 2.5 mmol/L (ref 0.5–1.9)

## 2020-01-13 LAB — I-STAT BETA HCG BLOOD, ED (MC, WL, AP ONLY): I-stat hCG, quantitative: <5 m[IU]/mL (ref <5)

## 2020-01-13 LAB — LIPASE, BLOOD: Lipase: 19 U/L (ref 11–51)

## 2020-01-13 MED ORDER — ACETAMINOPHEN 500 MG PO TABS
1000.0000 mg | ORAL_TABLET | Freq: Once | ORAL | Status: AC
  Administered 2020-01-13: 1000 mg via ORAL
  Filled 2020-01-13: qty 2

## 2020-01-13 MED ORDER — VANCOMYCIN HCL 10 G IV SOLR
1750.0000 mg | Freq: Once | INTRAVENOUS | Status: DC
  Filled 2020-01-13: qty 1750

## 2020-01-13 MED ORDER — SODIUM CHLORIDE 0.9 % IV BOLUS (SEPSIS): 1000.0000 mL | Freq: Once | INTRAVENOUS | Status: DC

## 2020-01-13 MED ORDER — KETOROLAC TROMETHAMINE 15 MG/ML IJ SOLN
30.0000 mg | Freq: Once | INTRAMUSCULAR | Status: DC
  Filled 2020-01-13: qty 2

## 2020-01-13 MED ORDER — INSULIN ASPART 100 UNIT/ML ~~LOC~~ SOLN
10.0000 [IU] | Freq: Once | SUBCUTANEOUS | Status: AC
  Administered 2020-01-13: 10 [IU] via INTRAVENOUS
  Filled 2020-01-13: qty 0.1

## 2020-01-13 MED ORDER — ONDANSETRON 4 MG PO TBDP: 4.0000 mg | ORAL_TABLET | Freq: Once | ORAL | Status: DC | PRN

## 2020-01-13 MED ORDER — SODIUM CHLORIDE 0.9 % IV SOLN: 2.0000 g | Freq: Once | INTRAVENOUS | Status: DC

## 2020-01-13 MED ORDER — CEPHALEXIN 500 MG PO CAPS
500.0000 mg | ORAL_CAPSULE | Freq: Once | ORAL | Status: AC
  Administered 2020-01-13: 500 mg via ORAL
  Filled 2020-01-13: qty 1

## 2020-01-13 MED ORDER — ACETAMINOPHEN 325 MG PO TABS: 650.0000 mg | ORAL_TABLET | Freq: Once | ORAL | Status: DC

## 2020-01-13 MED ORDER — SODIUM CHLORIDE 0.9 % IV BOLUS
1000.0000 mL | Freq: Once | INTRAVENOUS | Status: AC
  Administered 2020-01-13: 1000 mL via INTRAVENOUS

## 2020-01-13 MED ORDER — ONDANSETRON HCL 4 MG/2ML IJ SOLN
4.0000 mg | Freq: Once | INTRAMUSCULAR | Status: DC
  Filled 2020-01-13: qty 2

## 2020-01-13 MED ORDER — VANCOMYCIN HCL 1750 MG/350ML IV SOLN
1750.0000 mg | Freq: Once | INTRAVENOUS | Status: DC
  Filled 2020-01-13: qty 350

## 2020-01-13 MED ORDER — SODIUM CHLORIDE 0.9 % IV SOLN
1.0000 g | Freq: Once | INTRAVENOUS | Status: DC
  Filled 2020-01-13: qty 10

## 2020-01-13 MED ORDER — METRONIDAZOLE IN NACL 5-0.79 MG/ML-% IV SOLN: 500.0000 mg | Freq: Once | INTRAVENOUS | Status: DC

## 2020-01-13 MED ORDER — VANCOMYCIN HCL IN DEXTROSE 1-5 GM/200ML-% IV SOLN: 1000.0000 mg | Freq: Once | INTRAVENOUS | Status: DC

## 2020-01-13 NOTE — ED Triage Notes (Signed)
[place].   Per EMS-   Pt found 'nodding off,' easily awoken by EMS. EMS reports pt drinking large 'big gulp soda.' Pt c/o fatigue.

## 2020-01-13 NOTE — ED Triage Notes (Signed)
Pts states that  "I have throwing up for 3 days and feeling weak, I think it is from being pregnant, I smoke and I think I will need rehab for cocaine, it is controling my pee it feels like acid and cached up white".

## 2020-01-13 NOTE — ED Notes (Signed)
Date and time results received: 01/13/20 10:20 PM (use smartphrase ".now" to insert current time)  Test: Lactic Acid Critical Value: 2.5   Name of Provider Notified: Schlossman  Orders Received? Or Actions Taken?:

## 2020-01-13 NOTE — ED Provider Notes (Addendum)
[place] DEPT Provider Note   CSN: [id number] Arrival date & time: 01/13/20  1847     History Chief Complaint  Patient presents with  . Fatigue  . Weakness    Dana Noble is a 34 y.o. female.  HPI      Presents with concern for nausea, vomiting, abdominal pain, generalized weakness Believes she is pregnant [redacted]wk (negative test today) Symptoms began yesterday, threw up 3 times, no blood No diarrhea No dysuria, frequency No cough, congestion, sore throat Glucose has been high, 500s Not taking anything for her DM No SI Fever, didn't know she had it until checked in ED  Wants rehab from cocaine, food and a place to stay and sleep Past Medical History:  Diagnosis Date  . Diabetes mellitus without complication [facility])     Patient Active Problem List   Diagnosis Date Noted  . Malingering 03/01/2019  . Adjustment disorder with mixed disturbance of emotions and conduct   . 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 No pertinent surgical history.   OB History   No obstetric history on file.     History reviewed. No pertinent family history.  Social History   Tobacco Use  . Smoking status: Never Smoker  . Smokeless tobacco: Never Used  Substance Use Topics  . Alcohol use: Not Currently  . Drug use: Not Currently    Home Medications Prior to Admission medications   Medication Sig Start Date End Date Taking? Authorizing Provider  cephALEXin (KEFLEX) 500 MG capsule Take 1 capsule (500 mg total) by mouth 2 (two) times daily for 14 days. 01/14/20 01/28/20  Gareth Morgan, MD  glucose monitoring kit (FREESTYLE) monitoring kit 1 each by Does not apply route as needed for other. 02/26/19   Rancour, Annie Main, MD  metFORMIN (GLUCOPHAGE) 500 MG tablet Take 1 tablet (500 mg total) by mouth 2 (two) times daily with a meal. Patient not taking: Reported on 01/13/2020 04/27/19   Palumbo, April, MD  ondansetron (ZOFRAN  ODT) 4 MG disintegrating tablet Take 1 tablet (4 mg total) by mouth every 8 (eight) hours as needed for nausea or vomiting. 01/14/20   Gareth Morgan, MD    Allergies    5-alpha reductase inhibitors  Review of Systems   Review of Systems  Constitutional: Positive for fatigue. Negative for fever.  HENT: Negative for sore throat.   Eyes: Negative for visual disturbance.  Respiratory: Negative for cough and shortness of breath.   Cardiovascular: Negative for chest pain.  Gastrointestinal: Positive for abdominal pain, nausea and vomiting. Negative for constipation and diarrhea.  Genitourinary: Negative for difficulty urinating and dysuria.  Musculoskeletal: Negative for back pain and neck pain.  Skin: Negative for rash.  Neurological: Negative for syncope and headaches.    Physical Exam Updated Vital Signs BP (S) (!) 150/92 (BP Location: Right Wrist) Comment (BP Location): Patient laying on arm.   Pulse (!) 111   Temp (!) 102.8 F (39.3 C) (Oral)   Resp (!) 22   SpO2 99%   Physical Exam Vitals and nursing note reviewed.  Constitutional:      General: She is not in acute distress.    Appearance: She is well-developed. She is not diaphoretic.  HENT:     Head: Normocephalic and atraumatic.  Eyes:     Conjunctiva/sclera: Conjunctivae normal.  Cardiovascular:     Rate and Rhythm: Regular rhythm. Tachycardia present.     Heart sounds: Normal heart sounds. No murmur. No  friction rub. No gallop.   Pulmonary:     Effort: Pulmonary effort is normal. No respiratory distress.     Breath sounds: Normal breath sounds. No wheezing or rales.  Abdominal:     General: There is no distension.     Palpations: Abdomen is soft.     Tenderness: There is no abdominal tenderness. There is no guarding.  Musculoskeletal:        General: No tenderness.     Cervical back: Normal range of motion.  Skin:    General: Skin is warm and dry.     Findings: No erythema or rash.  Neurological:      Mental Status: She is alert and oriented to person, place, and time.  Psychiatric:        Mood and Affect: Mood is depressed.        Thought Content: Thought content does not include homicidal or suicidal ideation. Thought content does not include homicidal or suicidal plan.     ED Results / Procedures / Treatments   Labs (all labs ordered are listed, but only abnormal results are displayed) Labs Reviewed  CBC WITH DIFFERENTIAL/PLATELET - Abnormal; Notable for the following components:      Result Value   RBC 5.21 (*)    Neutro Abs 8.0 (*)    Monocytes Absolute 1.1 (*)    All other components within normal limits  COMPREHENSIVE METABOLIC PANEL - Abnormal; Notable for the following components:   Sodium 132 (*)    Glucose, Bld 434 (*)    Calcium 8.7 (*)    Albumin 3.1 (*)    All other components within normal limits  LACTIC ACID, PLASMA - Abnormal; Notable for the following components:   Lactic Acid, Venous 2.5 (*)    All other components within normal limits  URINALYSIS, ROUTINE W REFLEX MICROSCOPIC - Abnormal; Notable for the following components:   Glucose, UA >=500 (*)    Leukocytes,Ua SMALL (*)    WBC, UA >50 (*)    Bacteria, UA RARE (*)    All other components within normal limits  CBG MONITORING, ED - Abnormal; Notable for the following components:   Glucose-Capillary 191 (*)    All other components within normal limits  CBG MONITORING, ED - Abnormal; Notable for the following components:   Glucose-Capillary 226 (*)    All other components within normal limits  URINE CULTURE  SARS CORONAVIRUS 2 BY RT PCR (HOSPITAL ORDER, [place] [facility])  LACTIC ACID, PLASMA  BLOOD GAS, VENOUS  I-STAT BETA HCG BLOOD, ED ([facility], [facility], [facility] ONLY)  I-STAT CHEM 8, ED    EKG None  Radiology DG Chest Port 1 View  Result Date: 01/13/2020 CLINICAL DATA:  Fever and vomiting. EXAM: PORTABLE CHEST 1 VIEW COMPARISON:  None. FINDINGS: The cardiac silhouette, mediastinal and  hilar contours or within normal limits. Slightly low lung volumes with mild vascular crowding but no infiltrates, edema or effusions. No pulmonary lesions. The bony structures are normal. IMPRESSION: Slightly low lung volumes but no acute pulmonary findings. Electronically Signed   By: Marijo Sanes M.D.   On: 01/13/2020 08:11    Procedures Procedures (including critical care time)  Medications Ordered in ED Medications  cefTRIAXone (ROCEPHIN) 1 g in sodium chloride 0.9 % 100 mL IVPB (1 g Intravenous Not Given 01/13/20 2253)  sodium chloride 0.9 % bolus 1,000 mL (has no administration in time range)  sodium chloride 0.9 % bolus 1,000 mL (has no administration in  time range)  sodium chloride 0.9 % bolus 1,000 mL (0 mLs Intravenous Stopped 01/13/20 2356)  acetaminophen (TYLENOL) tablet 1,000 mg (1,000 mg Oral Given 01/13/20 2007)  cephALEXin (KEFLEX) capsule 500 mg (500 mg Oral Given 01/13/20 2305)  insulin aspart (novoLOG) injection 10 Units (10 Units Intravenous Given 01/13/20 2305)    ED Course  I have reviewed the triage vital signs and the nursing notes.  Pertinent labs & imaging results that were available during my care of the patient were reviewed by me and considered in my medical decision making (see chart for details).    MDM Rules/Calculators/A&P                      34yo female with history of DM presents with concern for nausea, vomiting, abdominal pain and generalized weakness.  CMP with hyperglycemia, normal bicarb, no anion gap, no ketones in the urine. Lactate is 2.5. Leukocytosis last night to 15, 9.9 now.   UA consistent with UTI. Temperature 102.1 on arrival. Abd exam benign, doubt appendicitis, diverticulitis.   Ordered IV fluids, rocephin however she declined IV medications.  She is refusing monitoring, IV abx, COVID testing initially and is uncooperative for nursing assessments.  She is perseverating on being pregnant (with negative hcg) and also reports she needs  help with cocaine rehabilitation.  Despite her delusions she appears to have the capacity to make her own decisions regarding refusing certain testing and treatments.  Initially gave keflex, tylenol however she has persistent worsening fever and tachycardia.  Concern for sepsis secondary to UTI and in addition to hyperglycemia in a patient with poor follow up, no medications for her DM, poor insight into her medical condition and recommend admission.    Patient now states she will cooperate with IV abx, covid testing and additional fluids.   When nurse attempted to hang additional fluids, she again declines, states she wants a glass of water and to leave.  We have already discussed my recommendation to stay in the hospital for additional treatment however she would like to leave against medical advice.  Given rx for keflex for UTI and zofran. Given resources for rehab.   Final Clinical Impression(s) / ED Diagnoses Final diagnoses:  Non-intractable vomiting with nausea, unspecified vomiting type  Sepsis without acute organ dysfunction, due to unspecified organism ([place])  Hyperglycemia  Delusion of pregnancy  Urinary tract infection without hematuria, site unspecified  Fever, unspecified fever cause  COVID-19 virus test result unknown  Left against medical advice  Cocaine use disorder ([place])    Rx / DC Orders ED Discharge Orders         Ordered    cephALEXin (KEFLEX) 500 MG capsule  2 times daily     01/14/20 0245    ondansetron (ZOFRAN ODT) 4 MG disintegrating tablet  Every 8 hours PRN     01/14/20 0245             Gareth Morgan, MD 01/14/20 9150

## 2020-01-13 NOTE — ED Notes (Signed)
Attempted to obtain pt labs and administer pts IV medications as prescribed. Pt refused stating "All I want is fluids, I don't want my medications, I want to speak to the doctor about my treatment. I need to wash up". Pt went through cabinet closet to get wash clothes, took off cardiac leads, and blood pressure cuff and left room.  MD was notified.

## 2020-01-13 NOTE — ED Notes (Addendum)
This nurse attempted to speak with patient about giving her an antibiotic. Patient stating she only came here because she is pregnant and she needed some where to sleep that they told her she could spend the night here and get her in touch with a shelter. Patient refusing all care other than IV fluids, provider made aware

## 2020-01-13 NOTE — ED Triage Notes (Addendum)
Sts she just started vomiting prior to arrival. Thinks she is approx [redacted] weeks pregnant. Instructed to stop drinking soda in triage if she is vomiting.

## 2020-01-13 NOTE — Progress Notes (Signed)
Code sepsis cancelled, elink monitoring complete

## 2020-01-13 NOTE — ED Provider Notes (Signed)
[place] DEPT Provider Note   CSN: [id number] Arrival date & time: 01/13/20  0046     History Chief Complaint  Patient presents with  . Emesis During Pregnancy    Dana Noble is a 34 y.o. female female who presents with cc of homelessness. The patient believes she is pregnant. She states that she called EMS because she does not have a place to stay or live.  She thinks she is about [redacted] weeks pregnant.  She states that she had an ultrasound that proved this but cannot tell me where this occurred or what hospital was at.  She states that she has a please to take her to a shelter but they did not have any so they brought her here.  She says that she sometimes urinates on herself and has frequency of urination.  She was notably febrile and tachycardic in triage and I ordered a code sepsis.  She is also notably hyperglycemic.  The patient is somewhat tangential and disorganized but denies any active abdominal pain.  She thinks that her vomiting was due to pregnancy.  She denies knowing she had a fever or chills.  She has a slight cough which is chronic.  She states she just wants somewhere to sleep and shelter placement.  HPI     Past Medical History:  Diagnosis Date  . Diabetes mellitus without complication [facility])     Patient Active Problem List   Diagnosis Date Noted  . Malingering 03/01/2019  . Adjustment disorder with mixed disturbance of emotions and conduct   . 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 No pertinent surgical history.   OB History   No obstetric history on file.     No family history on file.  Social History   Tobacco Use  . Smoking status: Never Smoker  . Smokeless tobacco: Never Used  Substance Use Topics  . Alcohol use: Not Currently  . Drug use: Not Currently    Home Medications Prior to Admission medications   Medication Sig Start Date End Date Taking? Authorizing Provider  glucose  monitoring kit (FREESTYLE) monitoring kit 1 each by Does not apply route as needed for other. 02/26/19   Rancour, Annie Main, MD  metFORMIN (GLUCOPHAGE) 500 MG tablet Take 1 tablet (500 mg total) by mouth 2 (two) times daily with a meal. Patient not taking: Reported on 01/13/2020 04/27/19   Randal Buba, April, MD    Allergies    Patient has no known allergies.  Review of Systems   Review of Systems Ten systems reviewed and are negative for acute change, except as noted in the HPI.   Physical Exam Updated Vital Signs BP (!) 145/90 (BP Location: Right Arm)   Pulse 93   Temp 98.9 F (37.2 C) (Oral)   Resp 16   Ht '5\' 7"'$  (1.702 m)   Wt 90.7 kg   SpO2 98%   BMI 31.32 kg/m   Physical Exam Vitals and nursing note reviewed.  Constitutional:      General: She is not in acute distress.    Appearance: She is well-developed. She is not diaphoretic.     Comments: Disheveled appearance  HENT:     Head: Normocephalic and atraumatic.  Eyes:     General: No scleral icterus.    Conjunctiva/sclera: Conjunctivae normal.  Cardiovascular:     Rate and Rhythm: Normal rate and regular rhythm.     Heart sounds: Normal heart sounds. No murmur. No friction  rub. No gallop.   Pulmonary:     Effort: Pulmonary effort is normal. No respiratory distress.     Breath sounds: Normal breath sounds.  Abdominal:     General: Bowel sounds are normal. There is no distension.     Palpations: Abdomen is soft. There is no mass.     Tenderness: There is no abdominal tenderness. There is no right CVA tenderness, left CVA tenderness or guarding.  Musculoskeletal:     Cervical back: Normal range of motion.  Skin:    General: Skin is warm and dry.  Neurological:     Mental Status: She is alert and oriented to person, place, and time.  Psychiatric:        Mood and Affect: Mood is anxious. Affect is angry.        Speech: Speech is rapid and pressured and tangential.        Behavior: Behavior is withdrawn.        Cognition  and Memory: Cognition normal.     ED Results / Procedures / Treatments   Labs (all labs ordered are listed, but only abnormal results are displayed) Labs Reviewed  COMPREHENSIVE METABOLIC PANEL - Abnormal; Notable for the following components:      Result Value   Sodium 129 (*)    CO2 20 (*)    Glucose, Bld 451 (*)    Calcium 8.3 (*)    Total Protein 6.4 (*)    Albumin 3.4 (*)    All other components within normal limits  CBC - Abnormal; Notable for the following components:   WBC 15.0 (*)    Hemoglobin 11.8 (*)    All other components within normal limits  CBG MONITORING, ED - Abnormal; Notable for the following components:   Glucose-Capillary 399 (*)    All other components within normal limits  CULTURE, BLOOD (ROUTINE X 2)  CULTURE, BLOOD (ROUTINE X 2)  URINE CULTURE  SARS CORONAVIRUS 2 BY RT PCR (HOSPITAL ORDER, [place] [facility])  LIPASE, BLOOD  URINALYSIS, ROUTINE W REFLEX MICROSCOPIC  LACTIC ACID, PLASMA  LACTIC ACID, PLASMA  APTT  PROTIME-INR  RAPID URINE DRUG SCREEN, HOSP PERFORMED  I-STAT BETA HCG BLOOD, ED ([facility], [facility], [facility] ONLY)    EKG EKG Interpretation  Date/Time:  Thursday Jan 13 2020 01:49:20 EDT Ventricular Rate:  102 PR Interval:    QRS Duration: 80 QT Interval:  302 QTC Calculation: 394 R Axis:   48 Text Interpretation: Sinus tachycardia Otherwise normal ECG No previous ECGs available Confirmed by Shanon Rosser [phone] on 01/13/2020 1:57:09 AM   Radiology DG Chest Port 1 View  Result Date: 01/13/2020 CLINICAL DATA:  Fever and vomiting. EXAM: PORTABLE CHEST 1 VIEW COMPARISON:  None. FINDINGS: The cardiac silhouette, mediastinal and hilar contours or within normal limits. Slightly low lung volumes with mild vascular crowding but no infiltrates, edema or effusions. No pulmonary lesions. The bony structures are normal. IMPRESSION: Slightly low lung volumes but no acute pulmonary findings. Electronically Signed   By: Marijo Sanes M.D.    On: 01/13/2020 08:11    Procedures Procedures (including critical care time)  Medications Ordered in ED Medications  ondansetron (ZOFRAN-ODT) disintegrating tablet 4 mg (has no administration in time range)  ceFEPIme (MAXIPIME) 2 g in sodium chloride 0.9 % 100 mL IVPB (has no administration in time range)  metroNIDAZOLE (FLAGYL) IVPB 500 mg (has no administration in time range)  sodium chloride 0.9 % bolus 1,000 mL (has no administration in  time range)    And  sodium chloride 0.9 % bolus 1,000 mL (has no administration in time range)    And  sodium chloride 0.9 % bolus 1,000 mL (has no administration in time range)  acetaminophen (TYLENOL) tablet 650 mg (has no administration in time range)  vancomycin (VANCOCIN) 1,750 mg in sodium chloride 0.9 % 500 mL IVPB (has no administration in time range)    ED Course  I have reviewed the triage vital signs and the nursing notes.  Pertinent labs & imaging results that were available during my care of the patient were reviewed by me and considered in my medical decision making (see chart for details).  Clinical Course as of Jan 13 1052  Thu Jan 13, 2020  9892 Sodium(!): 129 [AH]  0658 Glucose(!): 451 [AH]  0658 Anion gap: 11 [AH]  0658 WBC(!): 15.0 [AH]  0925 Patient refusing treatment or intervention. She    [AH]  1050 Patient refusing any further workup. She refuses UA and asking for dc.    [AH]    Clinical Course User Index [AH] Margarita Mail, PA-C   MDM Rules/Calculators/A&P                      Patient here with fever, tachycardia.  These have resolved without intervention.  Question if maybe the patient has a urinary tract infection.  She ate fit sepsis criteria however self resolved and has refused any other interventions since that time.  She is also notably very hyperglycemic but has refused IV fluids or treatment.  Does not want any further work-up.  I have discussed with the fact that I have not been able to complete a  work-up on her and she could have an infection causing her symptoms which could potentially cause her to become very ill or even death if it goes untreated.  Patient understands and still continues to refuse any more work-up.  Although she I has some very obvious underlying psychiatric illness she does appear to have the capacity to make the decision and to understand how sick she could potentially become.  I have also expressed that the patient may return at any time for further work-up.     Date: 09/06/4172 Patient: Dana Noble Admitted: 01/13/2020  6:48 AM Attending Provider: Sherwood Gambler, MD  Selmer Dominion or her authorized caregiver has made the decision for the patient to leave the emergency department against the advice of Margarita Mail, PA-C.  She or her authorized caregiver has been informed and understands the inherent risks, including death.  She or her authorized caregiver has decided to accept the responsibility for this decision. Dana Noble and all necessary parties have been advised that she may return for further evaluation or treatment. Her condition at time of discharge was Fair.  Dana Noble had current vital signs as follows:  Blood pressure (!) 145/90, pulse 93, temperature 98.9 F (37.2 C), temperature source Oral, resp. rate 16, height '5\' 7"'$  (1.702 m), weight 90.7 kg, SpO2 98 %.    Margarita Mail 01/13/2020     Final Clinical Impression(s) / ED Diagnoses Final diagnoses:  None    Rx / DC Orders ED Discharge Orders    None       Margarita Mail, PA-C 01/13/20 1103    Sherwood Gambler, MD 01/14/20 1003

## 2020-01-13 NOTE — ED Notes (Addendum)
Pt refused EKG, VITALS stated she wanted to be discharge, informed the nursse

## 2020-01-13 NOTE — Discharge Instructions (Addendum)
Get help right away if: You have shortness of breath or have trouble breathing. You are dizzy or you faint. You are disoriented or confused. You develop signs of dehydration, such as: Dark urine, very little urine, or no urine. Cracked lips. Dry mouth. Sunken eyes. Sleepiness. Weakness. You develop severe pain in your abdomen. You have persistent vomiting or diarrhea. You develop a skin rash. Your symptoms suddenly get worse. 

## 2020-01-13 NOTE — Progress Notes (Signed)
A consult was received from an ED physician for vanc/cefepime per pharmacy dosing.  The patient's profile has been reviewed for ht/wt/allergies/indication/available labs.   A one time order has been placed for vanc 1750mg  and cefepime 2g.  Further antibiotics/pharmacy consults should be ordered by admitting physician if indicated.                       Thank you, 01/13/2020  7:06 AM

## 2020-01-14 DIAGNOSIS — O219 Vomiting of pregnancy, unspecified: Secondary | ICD-10-CM | POA: Diagnosis not present

## 2020-01-14 LAB — CBG MONITORING, ED
Glucose-Capillary: 191 mg/dL — ABNORMAL HIGH (ref 70–99)
Glucose-Capillary: 226 mg/dL — ABNORMAL HIGH (ref 70–99)

## 2020-01-14 MED ORDER — SODIUM CHLORIDE 0.9 % IV BOLUS: 1000.0000 mL | Freq: Once | INTRAVENOUS | Status: DC

## 2020-01-14 MED ORDER — CEPHALEXIN 500 MG PO CAPS
500.0000 mg | ORAL_CAPSULE | Freq: Two times a day (BID) | ORAL | 0 refills | Status: DC
Start: 2020-01-14 — End: 2020-01-20

## 2020-01-14 MED ORDER — ONDANSETRON 4 MG PO TBDP
4.0000 mg | ORAL_TABLET | Freq: Three times a day (TID) | ORAL | 0 refills | Status: DC | PRN
Start: 2020-01-14 — End: 2020-01-20

## 2020-01-14 NOTE — ED Notes (Signed)
Patient given paper scrubs and socks per request due to wet clothes.

## 2020-01-14 NOTE — ED Notes (Signed)
Pt urinated in bed, nurse offered to assist pt clean up and change bed sheets and patient refused.

## 2020-01-14 NOTE — ED Notes (Signed)
MD had spoke with pt about being admitted. Pt agreed to this with MD. Nurse went in to instruct pt that we needed to give more fluids and antibiotics and obtain blood work. Pt refused all of these things and stated "I am just going to leave, all I want is water and I am leaving". Nurse made MD aware of this.

## 2020-01-14 NOTE — ED Notes (Signed)
Patient refused to sign AMA form. Ambulated out of the department with no difficulty.

## 2020-01-15 ENCOUNTER — Other Ambulatory Visit: Payer: Self-pay

## 2020-01-15 DIAGNOSIS — E1165 Type 2 diabetes mellitus with hyperglycemia: Secondary | ICD-10-CM | POA: Diagnosis not present

## 2020-01-15 DIAGNOSIS — Z590 Homelessness: Secondary | ICD-10-CM | POA: Insufficient documentation

## 2020-01-15 DIAGNOSIS — F209 Schizophrenia, unspecified: Secondary | ICD-10-CM | POA: Diagnosis not present

## 2020-01-15 DIAGNOSIS — F1410 Cocaine abuse, uncomplicated: Secondary | ICD-10-CM | POA: Insufficient documentation

## 2020-01-15 DIAGNOSIS — Z9119 Patient's noncompliance with other medical treatment and regimen: Secondary | ICD-10-CM | POA: Insufficient documentation

## 2020-01-15 LAB — CBG MONITORING, ED: Glucose-Capillary: 553 mg/dL (ref 70–99)

## 2020-01-15 NOTE — ED Notes (Signed)
Pt not in lobby at this time, screener stated pt was still in restroom cleaning up and changing into scrubs

## 2020-01-15 NOTE — ED Notes (Addendum)
Pt still in restroom in lobby, pt cleaning up herself, was given soap and shampoo by the screeners. Will attempt vitals and cbg when pt comes out of restroom.

## 2020-01-15 NOTE — ED Triage Notes (Signed)
Patient came in with [facility] from [facility]. Patient states she needs social work and wants to be in rehab. Patient has a hx of drug use. Patient refused IV and fluids from EMS of a blood sugar 421.

## 2020-01-16 ENCOUNTER — Emergency Department (HOSPITAL_COMMUNITY)
Admission: EM | Admit: 2020-01-16 | Discharge: 2020-01-16 | Disposition: A | Discharge status: Home / Self Care | Payer: Medicare Other | Attending: Emergency Medicine | Admitting: Emergency Medicine

## 2020-01-16 DIAGNOSIS — E1165 Type 2 diabetes mellitus with hyperglycemia: Secondary | ICD-10-CM | POA: Diagnosis not present

## 2020-01-16 DIAGNOSIS — R739 Hyperglycemia, unspecified: Secondary | ICD-10-CM

## 2020-01-16 DIAGNOSIS — Z5900 Homelessness unspecified: Secondary | ICD-10-CM

## 2020-01-16 LAB — COMPREHENSIVE METABOLIC PANEL
ALT: 19 U/L (ref 0–44)
AST: 33 U/L (ref 15–41)
Albumin: 3 g/dL — ABNORMAL LOW (ref 3.5–5.0)
Alkaline Phosphatase: 92 U/L (ref 38–126)
Anion gap: 12 (ref 5–15)
BUN: 6 mg/dL (ref 6–20)
CO2: 24 mmol/L (ref 22–32)
Calcium: 8.7 mg/dL — ABNORMAL LOW (ref 8.9–10.3)
Chloride: 94 mmol/L — ABNORMAL LOW (ref 98–111)
Creatinine, Ser: 0.84 mg/dL (ref 0.44–1.00)
GFR calc Af Amer: >60 mL/min (ref >60)
GFR calc non Af Amer: >60 mL/min (ref >60)
Glucose, Bld: 509 mg/dL (ref 70–99)
Potassium: 4.5 mmol/L (ref 3.5–5.1)
Sodium: 130 mmol/L — ABNORMAL LOW (ref 135–145)
Total Bilirubin: 0.8 mg/dL (ref 0.3–1.2)
Total Protein: 7.2 g/dL (ref 6.5–8.1)

## 2020-01-16 LAB — CBC WITH DIFFERENTIAL/PLATELET
Abs Immature Granulocytes: 0.06 10*3/uL (ref 0.00–0.07)
Basophils Absolute: 0 10*3/uL (ref 0.0–0.1)
Basophils Relative: 0 %
Eosinophils Absolute: 0 10*3/uL (ref 0.0–0.5)
Eosinophils Relative: 0 %
HCT: 36.2 % (ref 36.0–46.0)
Hemoglobin: 12 g/dL (ref 12.0–15.0)
Immature Granulocytes: 1 %
Lymphocytes Relative: 10 %
Lymphs Abs: 1 10*3/uL (ref 0.7–4.0)
MCH: 27.6 pg (ref 26.0–34.0)
MCHC: 33.1 g/dL (ref 30.0–36.0)
MCV: 83.2 fL (ref 80.0–100.0)
Monocytes Absolute: 1.4 10*3/uL — ABNORMAL HIGH (ref 0.1–1.0)
Monocytes Relative: 14 %
Neutro Abs: 7.7 10*3/uL (ref 1.7–7.7)
Neutrophils Relative %: 75 %
Platelets: 285 10*3/uL (ref 150–400)
RBC: 4.35 MIL/uL (ref 3.87–5.11)
RDW: 13.7 % (ref 11.5–15.5)
WBC: 10.2 10*3/uL (ref 4.0–10.5)
nRBC: 0 % (ref 0.0–0.2)

## 2020-01-16 LAB — LACTIC ACID, PLASMA: Lactic Acid, Venous: 1.2 mmol/L (ref 0.5–1.9)

## 2020-01-16 LAB — URINE CULTURE: Culture: >=100000 — AB

## 2020-01-16 LAB — URINALYSIS, ROUTINE W REFLEX MICROSCOPIC
Bilirubin Urine: NEGATIVE
Glucose, UA: >=500 mg/dL — AB
Ketones, ur: NEGATIVE mg/dL
Nitrite: POSITIVE — AB
Protein, ur: NEGATIVE mg/dL
RBC / HPF: >50 RBC/hpf — ABNORMAL HIGH (ref 0–5)
Specific Gravity, Urine: 1.023 (ref 1.005–1.030)
WBC, UA: >50 WBC/hpf — ABNORMAL HIGH (ref 0–5)
pH: 6 (ref 5.0–8.0)

## 2020-01-16 LAB — RAPID URINE DRUG SCREEN, HOSP PERFORMED
Amphetamines: NOT DETECTED
Barbiturates: NOT DETECTED
Benzodiazepines: NOT DETECTED
Cocaine: NOT DETECTED
Opiates: NOT DETECTED
Tetrahydrocannabinol: NOT DETECTED

## 2020-01-16 LAB — I-STAT BETA HCG BLOOD, ED (MC, WL, AP ONLY): I-stat hCG, quantitative: 7.2 m[IU]/mL — ABNORMAL HIGH (ref <5)

## 2020-01-16 LAB — ETHANOL: Alcohol, Ethyl (B): <10 mg/dL (ref <10)

## 2020-01-16 LAB — HCG, QUANTITATIVE, PREGNANCY: hCG, Beta Chain, Quant, S: 1 m[IU]/mL (ref <5)

## 2020-01-16 MED ORDER — DEXTROSE-NACL 5-0.45 % IV SOLN: INTRAVENOUS | Status: DC

## 2020-01-16 MED ORDER — INSULIN ASPART 100 UNIT/ML ~~LOC~~ SOLN
10.0000 [IU] | Freq: Once | SUBCUTANEOUS | Status: DC
  Filled 2020-01-16: qty 0.1

## 2020-01-16 MED ORDER — METFORMIN HCL 500 MG PO TABS
500.0000 mg | ORAL_TABLET | Freq: Two times a day (BID) | ORAL | 0 refills | Status: DC
Start: 2020-01-16 — End: 2020-01-20

## 2020-01-16 MED ORDER — DEXTROSE 50 % IV SOLN: 0.0000 mL | INTRAVENOUS | Status: DC | PRN

## 2020-01-16 MED ORDER — SODIUM CHLORIDE 0.9 % IV BOLUS
1000.0000 mL | Freq: Once | INTRAVENOUS | Status: AC
  Administered 2020-01-16: 1000 mL via INTRAVENOUS

## 2020-01-16 MED ORDER — SODIUM CHLORIDE 0.9 % IV SOLN: INTRAVENOUS | Status: DC

## 2020-01-16 MED ORDER — INSULIN REGULAR(HUMAN) IN NACL 100-0.9 UT/100ML-% IV SOLN: INTRAVENOUS | Status: DC

## 2020-01-16 NOTE — ED Provider Notes (Signed)
[place] DEPT Provider Note   CSN: [id number] Arrival date & time: 01/15/20  2053   Time seen 1:42 AM  History Chief Complaint  Patient presents with  . Hyperglycemia    Dana Noble is a 34 y.o. female.  HPI   When I go into the room the patient starts telling me that she stranded in homeless and she has been doing crack and cocaine. She states she smokes it and denies IV use. She states she had a ultrasound 4 months ago and that she was [redacted] weeks pregnant at that time. She also states she needs rehab. She states she is diabetic but she is not taking any medications. She does finally admit she used to be on insulin. She also does finally admit she has a history of schizophrenia but she does not take medicine for that. Patient states she does not know where she was born or what city she lived in. She actually started to argue with me that she was in [place] the last couple days however she has been in the ER the last couple days here so she did not realize that she is in [place]. She states she took a bus here but does not know where the bus came from. Patient also states despite being pregnant that she is now having her menses.   PCP Patient, No Pcp Per   Past Medical History:  Diagnosis Date  . Diabetes mellitus without complication [facility])     Patient Active Problem List   Diagnosis Date Noted  . Malingering 03/01/2019  . Adjustment disorder with mixed disturbance of emotions and conduct   . 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   OB History   No obstetric history on file.     No family history on file.  Social History   Tobacco Use  . Smoking status: Never Smoker  . Smokeless tobacco: Never Used  Substance Use Topics  . Alcohol use: Not Currently  . Drug use: Not Currently  Homeless States she does crack and cocaine   Home Medications Prior to Admission medications     Medication Sig Start Date End Date Taking? Authorizing Provider  cephALEXin (KEFLEX) 500 MG capsule Take 1 capsule (500 mg total) by mouth 2 (two) times daily for 14 days. 01/14/20 01/28/20 Yes Gareth Morgan, MD  glucose monitoring kit (FREESTYLE) monitoring kit 1 each by Does not apply route as needed for other. 02/26/19  Yes Rancour, Annie Main, MD  ondansetron (ZOFRAN ODT) 4 MG disintegrating tablet Take 1 tablet (4 mg total) by mouth every 8 (eight) hours as needed for nausea or vomiting. 01/14/20  Yes Gareth Morgan, MD  metFORMIN (GLUCOPHAGE) 500 MG tablet Take 1 tablet (500 mg total) by mouth 2 (two) times daily with a meal. 01/16/20   Rolland Porter, MD    Allergies    5-alpha reductase inhibitors  Review of Systems   Review of Systems  All other systems reviewed and are negative.   Physical Exam Updated Vital Signs BP 108/90 (BP Location: Right Arm)   Pulse (!) 105   Temp (!) 100.4 F (38 C) (Oral)   Resp 17   Wt 83.9 kg   SpO2 100%   BMI 28.96 kg/m   Physical Exam Vitals and nursing note reviewed.  Constitutional:      Appearance: Normal appearance. She is normal weight.  HENT:     Head: Normocephalic and atraumatic.  Right Ear: External ear normal.     Left Ear: External ear normal.     Nose: Nose normal.     Mouth/Throat:     Mouth: Mucous membranes are moist.     Pharynx: No oropharyngeal exudate or posterior oropharyngeal erythema.  Eyes:     Extraocular Movements: Extraocular movements intact.     Conjunctiva/sclera: Conjunctivae normal.     Pupils: Pupils are equal, round, and reactive to light.  Cardiovascular:     Rate and Rhythm: Normal rate and regular rhythm.  Pulmonary:     Effort: Pulmonary effort is normal. No respiratory distress.     Breath sounds: Normal breath sounds.  Abdominal:     General: Abdomen is flat. Bowel sounds are normal.     Palpations: Abdomen is soft.     Tenderness: There is no abdominal tenderness.  Musculoskeletal:         General: Normal range of motion.     Cervical back: Normal range of motion.  Skin:    General: Skin is warm and dry.  Neurological:     General: No focal deficit present.     Mental Status: She is alert.     Cranial Nerves: No cranial nerve deficit.  Psychiatric:        Mood and Affect: Mood is anxious.        Speech: Speech is rapid and pressured.     Comments: Patient rapidly changes topics and is very evasive about her answers.     ED Results / Procedures / Treatments   Labs (all labs ordered are listed, but only abnormal results are displayed) Results for orders placed or performed during the hospital encounter of 01/16/20  Lactic acid, plasma  Result Value Ref Range   Lactic Acid, Venous 1.2 0.5 - 1.9 mmol/L  CBC with Differential  Result Value Ref Range   WBC 10.2 4.0 - 10.5 K/uL   RBC 4.35 3.87 - 5.11 MIL/uL   Hemoglobin 12.0 12.0 - 15.0 g/dL   HCT 36.2 36.0 - 46.0 %   MCV 83.2 80.0 - 100.0 fL   MCH 27.6 26.0 - 34.0 pg   MCHC 33.1 30.0 - 36.0 g/dL   RDW 13.7 11.5 - 15.5 %   Platelets 285 150 - 400 K/uL   nRBC 0.0 0.0 - 0.2 %   Neutrophils Relative % 75 %   Neutro Abs 7.7 1.7 - 7.7 K/uL   Lymphocytes Relative 10 %   Lymphs Abs 1.0 0.7 - 4.0 K/uL   Monocytes Relative 14 %   Monocytes Absolute 1.4 (H) 0.1 - 1.0 K/uL   Eosinophils Relative 0 %   Eosinophils Absolute 0.0 0.0 - 0.5 K/uL   Basophils Relative 0 %   Basophils Absolute 0.0 0.0 - 0.1 K/uL   Immature Granulocytes 1 %   Abs Immature Granulocytes 0.06 0.00 - 0.07 K/uL  Urinalysis, Routine w reflex microscopic  Result Value Ref Range   Color, Urine YELLOW YELLOW   APPearance HAZY (A) CLEAR   Specific Gravity, Urine 1.023 1.005 - 1.030   pH 6.0 5.0 - 8.0   Glucose, UA >=500 (A) NEGATIVE mg/dL   Hgb urine dipstick LARGE (A) NEGATIVE   Bilirubin Urine NEGATIVE NEGATIVE   Ketones, ur NEGATIVE NEGATIVE mg/dL   Protein, ur NEGATIVE NEGATIVE mg/dL   Nitrite POSITIVE (A) NEGATIVE   Leukocytes,Ua SMALL (A)  NEGATIVE   RBC / HPF >50 (H) 0 - 5 RBC/hpf   WBC, UA >50 (H) 0 -  5 WBC/hpf   Bacteria, UA RARE (A) NONE SEEN   Squamous Epithelial / LPF 0-5 0 - 5  Urine rapid drug screen (hosp performed)  Result Value Ref Range   Opiates NONE DETECTED NONE DETECTED   Cocaine NONE DETECTED NONE DETECTED   Benzodiazepines NONE DETECTED NONE DETECTED   Amphetamines NONE DETECTED NONE DETECTED   Tetrahydrocannabinol NONE DETECTED NONE DETECTED   Barbiturates NONE DETECTED NONE DETECTED  Ethanol  Result Value Ref Range   Alcohol, Ethyl (B) <10 <10 mg/dL  Comprehensive metabolic panel  Result Value Ref Range   Sodium 130 (L) 135 - 145 mmol/L   Potassium 4.5 3.5 - 5.1 mmol/L   Chloride 94 (L) 98 - 111 mmol/L   CO2 24 22 - 32 mmol/L   Glucose, Bld 509 (HH) 70 - 99 mg/dL   BUN 6 6 - 20 mg/dL   Creatinine, Ser 0.84 0.44 - 1.00 mg/dL   Calcium 8.7 (L) 8.9 - 10.3 mg/dL   Total Protein 7.2 6.5 - 8.1 g/dL   Albumin 3.0 (L) 3.5 - 5.0 g/dL   AST 33 15 - 41 U/L   ALT 19 0 - 44 U/L   Alkaline Phosphatase 92 38 - 126 U/L   Total Bilirubin 0.8 0.3 - 1.2 mg/dL   GFR calc non Af Amer >60 >60 mL/min   GFR calc Af Amer >60 >60 mL/min   Anion gap 12 5 - 15  hCG, quantitative, pregnancy  Result Value Ref Range   hCG, Beta Chain, Quant, S 1 <5 mIU/mL  CBG monitoring, ED  Result Value Ref Range   Glucose-Capillary 553 (HH) 70 - 99 mg/dL   Comment 1 Notify RN   I-Stat beta hCG blood, ED  Result Value Ref Range   I-stat hCG, quantitative 7.2 (H) <5 mIU/mL   Comment 3            Laboratory interpretation all normal except glucosuria, hyponatremia due to hyperglycemia, malnutrition    EKG None  Radiology No results found.   DG Chest Port 1 View  Result Date: 01/13/2020 CLINICAL DATA:  Fever and vomiting.  IMPRESSION: Slightly low lung volumes but no acute pulmonary findings. Electronically Signed   By: Marijo Sanes M.D.   On: 01/13/2020 08:11   Procedures Procedures (including critical care  time)  Medications Ordered in ED Medications  insulin regular, human (MYXREDLIN) 100 units/ 100 mL infusion ( Intravenous Refused 01/16/20 0703)  0.9 %  sodium chloride infusion ( Intravenous Refused 01/16/20 0703)  dextrose 5 %-0.45 % sodium chloride infusion ( Intravenous Refused 01/16/20 0704)  dextrose 50 % solution 0-50 mL (has no administration in time range)  insulin aspart (novoLOG) injection 10 Units (has no administration in time range)  sodium chloride 0.9 % bolus 1,000 mL (0 mLs Intravenous Stopped 01/16/20 7948)    ED Course  I have reviewed the triage vital signs and the nursing notes.  Pertinent labs & imaging results that were available during my care of the patient were reviewed by me and considered in my medical decision making (see chart for details).    MDM Rules/Calculators/A&P                      Patient's beta hCG was mildly elevated at 7.  Serum beta hCG was done in the lab and was 1.  Patient is not pregnant.  She actually is starting her menses today.  After patient's c-Met resulted she was started on IV  insulin for nonketotic nonacidotic hyperglycemia.  Goal will be patient can be discharged once her blood sugar is in the 200s.  After reviewing patient's c-Met she was started on insulin drip for nonketotic nonacidotic hyperglycemia.  7:00 AM patient was supposed to be getting IV insulin drip however nurse reports she had her IV removed.  She states she wants to get subcu insulin but she has no idea how much she would get.  Patient states she feels better and she is ready to be discharged.  Patient was informed that she is not pregnant.  Final Clinical Impression(s) / ED Diagnoses Final diagnoses:  Hyperglycemia  Homeless    Rx / DC Orders ED Discharge Orders         Ordered    metFORMIN (GLUCOPHAGE) 500 MG tablet  2 times daily with meals     01/16/20 0709         Plan discharge  Rolland Porter, MD, Barbette Or, MD 01/16/20 [phone]

## 2020-01-16 NOTE — Discharge Instructions (Addendum)
You need to try to find a local doctor to manage your diabetes.  I gave you a couple numbers that you can try.  You need to take some medication for your diabetes, you can start the Metformin until you can get a local doctor to get you onto insulin.

## 2020-01-16 NOTE — ED Notes (Signed)
Pt refused 2nd lactic drawl

## 2020-01-16 NOTE — ED Notes (Signed)
Pt refuses to wear socks and keeps walking in the hallway.

## 2020-01-16 NOTE — ED Notes (Signed)
Pt refused to allow 2nd lactic drawl

## 2020-01-16 NOTE — ED Notes (Signed)
Date and time results received: 01/16/20 6:08 AM  (use smartphrase ".now" to insert current time)  Test: Glucose Critical Value: 509  Name of Provider Notified: Melvenia Beam, [place]  Orders Received? Or Actions Taken?:

## 2020-01-16 NOTE — Progress Notes (Signed)
Very nice vein in anterior FA with [place] but pt wouldn't allow me to continue.

## 2020-01-17 ENCOUNTER — Emergency Department (HOSPITAL_COMMUNITY)
Admission: EM | Admit: 2020-01-17 | Discharge: 2020-01-18 | Disposition: A | Discharge status: Home / Self Care | Payer: Medicare Other | Attending: Emergency Medicine | Admitting: Emergency Medicine

## 2020-01-17 ENCOUNTER — Other Ambulatory Visit: Payer: Self-pay

## 2020-01-17 DIAGNOSIS — R739 Hyperglycemia, unspecified: Secondary | ICD-10-CM

## 2020-01-17 DIAGNOSIS — E1165 Type 2 diabetes mellitus with hyperglycemia: Secondary | ICD-10-CM | POA: Diagnosis not present

## 2020-01-17 DIAGNOSIS — Z5900 Homelessness unspecified: Secondary | ICD-10-CM

## 2020-01-17 DIAGNOSIS — Z79899 Other long term (current) drug therapy: Secondary | ICD-10-CM | POA: Insufficient documentation

## 2020-01-17 NOTE — ED Triage Notes (Signed)
Patient seen multiple times this week for same. EMS was called out for "diabetic issues." Patient ambulatory on arrival. Patient's CBG was 546. Patient then began asking about information about shelters, but requested transport to the hospital. Patient states she does not take her insulin and is non compliant. Patient denies hyperglycemia symptoms and other complaints.

## 2020-01-18 ENCOUNTER — Encounter (HOSPITAL_COMMUNITY): Payer: Self-pay | Admitting: Emergency Medicine

## 2020-01-18 DIAGNOSIS — E1165 Type 2 diabetes mellitus with hyperglycemia: Secondary | ICD-10-CM | POA: Diagnosis not present

## 2020-01-18 LAB — CBC
HCT: 33.2 % — ABNORMAL LOW (ref 36.0–46.0)
Hemoglobin: 11.1 g/dL — ABNORMAL LOW (ref 12.0–15.0)
MCH: 27.5 pg (ref 26.0–34.0)
MCHC: 33.4 g/dL (ref 30.0–36.0)
MCV: 82.2 fL (ref 80.0–100.0)
Platelets: 370 10*3/uL (ref 150–400)
RBC: 4.04 MIL/uL (ref 3.87–5.11)
RDW: 13.8 % (ref 11.5–15.5)
WBC: 11.7 10*3/uL — ABNORMAL HIGH (ref 4.0–10.5)
nRBC: 0 % (ref 0.0–0.2)

## 2020-01-18 LAB — BASIC METABOLIC PANEL WITH GFR
Anion gap: 10 (ref 5–15)
BUN: 6 mg/dL (ref 6–20)
CO2: 28 mmol/L (ref 22–32)
Calcium: 8.7 mg/dL — ABNORMAL LOW (ref 8.9–10.3)
Chloride: 94 mmol/L — ABNORMAL LOW (ref 98–111)
Creatinine, Ser: 0.74 mg/dL (ref 0.44–1.00)
GFR calc Af Amer: >60 mL/min
GFR calc non Af Amer: >60 mL/min
Glucose, Bld: 440 mg/dL — ABNORMAL HIGH (ref 70–99)
Potassium: 3.8 mmol/L (ref 3.5–5.1)
Sodium: 132 mmol/L — ABNORMAL LOW (ref 135–145)

## 2020-01-18 LAB — I-STAT BETA HCG BLOOD, ED (MC, WL, AP ONLY): I-stat hCG, quantitative: <5 m[IU]/mL

## 2020-01-18 LAB — CBG MONITORING, ED: Glucose-Capillary: 482 mg/dL — ABNORMAL HIGH (ref 70–99)

## 2020-01-18 MED ORDER — SODIUM CHLORIDE 0.9 % IV BOLUS
1000.0000 mL | Freq: Once | INTRAVENOUS | Status: AC
  Administered 2020-01-18: 1000 mL via INTRAVENOUS

## 2020-01-18 NOTE — ED Notes (Addendum)
Patient came out of room stating she needed to use the restroom. This RN approached patient and patient was standing in the door way urinating in the floor. When this writer asks patient why she didn't call out, patient stated "I don't know, I just woke up and had to go." Patient provided with scrubs and new socks.

## 2020-01-18 NOTE — Discharge Instructions (Addendum)
You were seen today for ongoing hyperglycemia.  You need to modify her diet.  Follow-up with [facility].  You will be provided with a list of shelters.  Follow-up there for ongoing housing needs.

## 2020-01-18 NOTE — ED Provider Notes (Signed)
[place] DEPT Provider Note   CSN: [id number] Arrival date & time: 01/17/20  2351     History Chief Complaint  Patient presents with  . Medical Clearance  . Hyperglycemia    Dana Noble is a 34 y.o. female.  HPI     This is a 34 year old female with a history of diabetes and homelessness who presents with hyperglycemia and requesting shelter.  Patient was seen less than 24 hours ago for the same.  Noted her blood sugars to be elevated at home.  Reports that she is not currently taking insulin because she is homeless and cannot afford it.  She also reports that she cannot afford good food and has been eating sugary things.  She has no physical complaints including chest pain, shortness of breath, abdominal pain, nausea, vomiting.  Reports that she believes that she is pregnant but cannot tell me how far along.  She states that "I just did not feel safe sleeping on the ground tonight."  Denies SI or HI.  Reports that she has sought homeless shelters but "they do not have any room."  She states "I just think I need to be monitored."  Past Medical History:  Diagnosis Date  . Diabetes mellitus without complication [facility])     Patient Active Problem List   Diagnosis Date Noted  . Malingering 03/01/2019  . Adjustment disorder with mixed disturbance of emotions and conduct   . 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 No pertinent surgical history.   OB History   No obstetric history on file.     No family history on file.  Social History   Tobacco Use  . Smoking status: Never Smoker  . Smokeless tobacco: Never Used  Substance Use Topics  . Alcohol use: Not Currently  . Drug use: Yes    Types: Cocaine    Home Medications Prior to Admission medications   Medication Sig Start Date End Date Taking? Authorizing Provider  cephALEXin (KEFLEX) 500 MG capsule Take 1 capsule (500 mg total) by mouth 2 (two) times  daily for 14 days. 01/14/20 01/28/20  Gareth Morgan, MD  glucose monitoring kit (FREESTYLE) monitoring kit 1 each by Does not apply route as needed for other. 02/26/19   Rancour, Annie Main, MD  metFORMIN (GLUCOPHAGE) 500 MG tablet Take 1 tablet (500 mg total) by mouth 2 (two) times daily with a meal. 01/16/20   Rolland Porter, MD  ondansetron (ZOFRAN ODT) 4 MG disintegrating tablet Take 1 tablet (4 mg total) by mouth every 8 (eight) hours as needed for nausea or vomiting. 01/14/20   Gareth Morgan, MD    Allergies    5-alpha reductase inhibitors  Review of Systems   Review of Systems  Constitutional: Negative for fever.  Respiratory: Negative for shortness of breath.   Cardiovascular: Negative for chest pain.  Gastrointestinal: Negative for abdominal pain, nausea and vomiting.  Endocrine: Negative for polydipsia and polyuria.  Genitourinary: Negative for dysuria.  All other systems reviewed and are negative.   Physical Exam Updated Vital Signs BP (!) 142/81 (BP Location: Left Arm)   Pulse 74   Temp 99.8 F (37.7 C) (Oral)   Resp 15   SpO2 100%   Physical Exam Vitals and nursing note reviewed.  Constitutional:      Appearance: She is well-developed. She is not ill-appearing.     Comments: Disheveled  HENT:     Head: Normocephalic and atraumatic.     Nose:  Nose normal.     Mouth/Throat:     Mouth: Mucous membranes are dry.  Eyes:     Pupils: Pupils are equal, round, and reactive to light.  Cardiovascular:     Rate and Rhythm: Normal rate and regular rhythm.     Heart sounds: Normal heart sounds.  Pulmonary:     Effort: Pulmonary effort is normal. No respiratory distress.     Breath sounds: No wheezing.  Abdominal:     General: Bowel sounds are normal.     Palpations: Abdomen is soft.  Musculoskeletal:     Cervical back: Neck supple.     Right lower leg: No edema.     Left lower leg: No edema.  Skin:    General: Skin is warm and dry.  Neurological:     Mental Status:  She is alert and oriented to person, place, and time.  Psychiatric:        Mood and Affect: Mood normal.     ED Results / Procedures / Treatments   Labs (all labs ordered are listed, but only abnormal results are displayed) Labs Reviewed  CBC - Abnormal; Notable for the following components:      Result Value   WBC 11.7 (*)    Hemoglobin 11.1 (*)    HCT 33.2 (*)    All other components within normal limits  BASIC METABOLIC PANEL - Abnormal; Notable for the following components:   Sodium 132 (*)    Chloride 94 (*)    Glucose, Bld 440 (*)    Calcium 8.7 (*)    All other components within normal limits  CBG MONITORING, ED - Abnormal; Notable for the following components:   Glucose-Capillary 482 (*)    All other components within normal limits  I-STAT BETA HCG BLOOD, ED ([facility], [facility], [facility] ONLY)    EKG None  Radiology No results found.  Procedures Procedures (including critical care time)  Medications Ordered in ED Medications  sodium chloride 0.9 % bolus 1,000 mL (1,000 mLs Intravenous New Bag/Given 01/18/20 0129)    ED Course  I have reviewed the triage vital signs and the nursing notes.  Pertinent labs & imaging results that were available during my care of the patient were reviewed by me and considered in my medical decision making (see chart for details).    MDM Rules/Calculators/A&P                       Patient presents with concerns for hyperglycemia.  Also reports that she is homeless and needs a place to stay.  She is not taking insulin and states she does not have any financial resources.  She is overall nontoxic and otherwise does not have any physical complaints.  She was seen and evaluated for similar symptoms less than 24 hours ago.  At that time she was hyperglycemic but not in DKA.  Will repeat BMP given she has a screening blood sugar 42.  She was given a liter of fluids as well.  BMP shows pseudohyponatremia.  No anion gap.  No evidence of DKA.  Patient was  allowed to rest in the emergency room overnight.  Do not feel she has an acute emergent process.  Feel she is likely malingering some given her current lack of housing.  I did provide her with a resource guide for some homeless shelters.  She was encouraged to follow-up with [facility] regarding her ongoing needs for medical management of her hyperglycemia.  She was also noted to have a pack of doughnuts at the bedside.  I discussed with her the importance of diet modifications especially given her financial constraints and difficulty obtaining her medications.  After history, exam, and medical workup I feel the patient has been appropriately medically screened and is safe for discharge home. Pertinent diagnoses were discussed with the patient. Patient was given return precautions.   Final Clinical Impression(s) / ED Diagnoses Final diagnoses:  Hyperglycemia  Homeless    Rx / DC Orders ED Discharge Orders    None       Liya Strollo, Barbette Hair, MD 01/18/20 [phone]

## 2020-01-18 NOTE — ED Notes (Signed)
Unable to successfully get IV. Riki Rusk, RN to try

## 2020-01-18 NOTE — ED Notes (Signed)
Pt ambulated to the bathroom with no assistance. Gait steady  

## 2020-01-19 ENCOUNTER — Encounter (HOSPITAL_COMMUNITY): Payer: Self-pay

## 2020-01-19 ENCOUNTER — Emergency Department (HOSPITAL_COMMUNITY)
Admission: EM | Admit: 2020-01-19 | Discharge: 2020-01-19 | Disposition: A | Discharge status: Home / Self Care | Payer: Medicare Other | Attending: Emergency Medicine | Admitting: Emergency Medicine

## 2020-01-19 ENCOUNTER — Other Ambulatory Visit: Payer: Self-pay

## 2020-01-19 DIAGNOSIS — N899 Noninflammatory disorder of vagina, unspecified: Secondary | ICD-10-CM | POA: Diagnosis present

## 2020-01-19 DIAGNOSIS — Z5321 Procedure and treatment not carried out due to patient leaving prior to being seen by health care provider: Secondary | ICD-10-CM | POA: Insufficient documentation

## 2020-01-19 DIAGNOSIS — L292 Pruritus vulvae: Secondary | ICD-10-CM | POA: Insufficient documentation

## 2020-01-19 MED ORDER — ACETAMINOPHEN 325 MG PO TABS
650.0000 mg | ORAL_TABLET | Freq: Once | ORAL | Status: AC | PRN
  Administered 2020-01-19: 650 mg via ORAL
  Filled 2020-01-19: qty 2

## 2020-01-19 NOTE — ED Notes (Signed)
Pt called in ED lobby for labs, no answer x2. [employer]

## 2020-01-19 NOTE — ED Triage Notes (Signed)
Patient c/o vaginal discharge and itching since this AM. Patient had T. 100.4.  Patient states she needs resources for shelters.

## 2020-01-19 NOTE — ED Notes (Signed)
Pt called for in ED lobby for labs, no answer. [employer]

## 2020-01-20 ENCOUNTER — Other Ambulatory Visit: Payer: Self-pay

## 2020-01-20 ENCOUNTER — Emergency Department (HOSPITAL_COMMUNITY)
Admission: EM | Admit: 2020-01-20 | Discharge: 2020-01-20 | Disposition: A | Discharge status: Home / Self Care | Payer: Medicare Other | Attending: Emergency Medicine | Admitting: Emergency Medicine

## 2020-01-20 DIAGNOSIS — N3000 Acute cystitis without hematuria: Secondary | ICD-10-CM | POA: Diagnosis not present

## 2020-01-20 DIAGNOSIS — E1165 Type 2 diabetes mellitus with hyperglycemia: Secondary | ICD-10-CM | POA: Diagnosis present

## 2020-01-20 DIAGNOSIS — B9689 Other specified bacterial agents as the cause of diseases classified elsewhere: Secondary | ICD-10-CM | POA: Insufficient documentation

## 2020-01-20 DIAGNOSIS — Z5900 Homelessness unspecified: Secondary | ICD-10-CM

## 2020-01-20 DIAGNOSIS — Z9119 Patient's noncompliance with other medical treatment and regimen: Secondary | ICD-10-CM | POA: Insufficient documentation

## 2020-01-20 DIAGNOSIS — R739 Hyperglycemia, unspecified: Secondary | ICD-10-CM

## 2020-01-20 DIAGNOSIS — Z91199 Patient's noncompliance with other medical treatment and regimen due to unspecified reason: Secondary | ICD-10-CM

## 2020-01-20 LAB — URINALYSIS, ROUTINE W REFLEX MICROSCOPIC
Bilirubin Urine: NEGATIVE
Glucose, UA: >=500 mg/dL — AB
Ketones, ur: NEGATIVE mg/dL
Nitrite: NEGATIVE
Protein, ur: NEGATIVE mg/dL
Specific Gravity, Urine: 1.021 (ref 1.005–1.030)
WBC, UA: >50 WBC/hpf — ABNORMAL HIGH (ref 0–5)
pH: 6 (ref 5.0–8.0)

## 2020-01-20 LAB — BASIC METABOLIC PANEL
Anion gap: 10 (ref 5–15)
BUN: 5 mg/dL — ABNORMAL LOW (ref 6–20)
CO2: 25 mmol/L (ref 22–32)
Calcium: 8.2 mg/dL — ABNORMAL LOW (ref 8.9–10.3)
Chloride: 99 mmol/L (ref 98–111)
Creatinine, Ser: 0.59 mg/dL (ref 0.44–1.00)
GFR calc Af Amer: >60 mL/min (ref >60)
GFR calc non Af Amer: >60 mL/min (ref >60)
Glucose, Bld: 469 mg/dL — ABNORMAL HIGH (ref 70–99)
Potassium: 4.1 mmol/L (ref 3.5–5.1)
Sodium: 134 mmol/L — ABNORMAL LOW (ref 135–145)

## 2020-01-20 LAB — CBG MONITORING, ED: Glucose-Capillary: 458 mg/dL — ABNORMAL HIGH (ref 70–99)

## 2020-01-20 MED ORDER — FREESTYLE SYSTEM KIT: 1.0000 | PACK | 0 refills | Status: AC | PRN

## 2020-01-20 MED ORDER — METFORMIN HCL 500 MG PO TABS
500.0000 mg | ORAL_TABLET | Freq: Two times a day (BID) | ORAL | 0 refills | Status: DC
Start: 2020-01-20 — End: 2020-01-26

## 2020-01-20 MED ORDER — INSULIN ASPART 100 UNIT/ML ~~LOC~~ SOLN
10.0000 [IU] | Freq: Once | SUBCUTANEOUS | Status: AC
  Administered 2020-01-20: 10 [IU] via SUBCUTANEOUS
  Filled 2020-01-20: qty 0.1

## 2020-01-20 MED ORDER — FOSFOMYCIN TROMETHAMINE 3 G PO PACK
3.0000 g | PACK | Freq: Once | ORAL | Status: AC
  Administered 2020-01-20: 3 g via ORAL
  Filled 2020-01-20: qty 3

## 2020-01-20 MED FILL — METFORMIN HCL 500 MG TABS: 500 | 30 days supply | Qty: 60 | Fill #0

## 2020-01-20 NOTE — ED Notes (Signed)
1 empty bottle of soda and opened doritos found in the bedside. RN informed about patient high blood sugar but pt stated Im hungry.EDP informed.

## 2020-01-20 NOTE — ED Provider Notes (Signed)
07:00: Stable care of patient from [place] of still PA-C at change of shift pending case management evaluation.  Please see prior provider note for full H&P.  Briefly patient has a history of diabetes, has been seen in the emergency department several times within the past 1 week, has been unable to get her medications.  She has a UTI today, was treated with fosfomycin.  Plan at change of shift is for case management assessment to assist with medication needs subsequent discharge.  I discussed with Frankey Shown with transition of care team, patient can go to [facility] to receive her medications for free today..  Will discharge with this information as well as resources.      Dana Anderson, PA-C 01/20/20 0911    Gilda Crease, MD 01/21/20 [id number]

## 2020-01-20 NOTE — Discharge Instructions (Signed)
You were seen in the emergency department today and found to have a UTI with elevated blood sugar.  You were given antibiotics in the emergency department. We have discussed with our case management team, we would like you to go straight to the [facility] pharmacy in order to pick up your medications, these will be provided to you for free.    [facility] [address]  We would like you to follow-up with either the [facility] or the [facility] within 3 days for reevaluation.  Return to the emergency department for new or worsening symptoms including but not limited to fever, inability to keep fluids down, abdominal pain, increased thirst/urination, or any other concerns.

## 2020-01-20 NOTE — Discharge Planning (Signed)
RNCM consulted in regards to medication assistance.  Pt has insurance coverage and is not eligible for Medication Assistance Through [place] (MATCH) program. 

## 2020-01-20 NOTE — ED Notes (Signed)
Patient given water. Pt refusing to keep BP and pulse ox on at this time.

## 2020-01-20 NOTE — ED Provider Notes (Signed)
[place] DEPT Provider Note   CSN: [id number] Arrival date & time: 01/20/20  0425     History Chief Complaint  Patient presents with  . Urinary Tract Infection    Dana Noble is a 34 y.o. female.  Patient to ED by EMS for evaluation of elevated blood sugar. She denies nausea, vomiting. She denies taking her medications because she states she has no money to obtain them. She has not followed up on resources provided on last visit (01/17/20). She reports she feels "dirty down there" and "I know you don't have a shower but I need to wash with a wash cloth and warm water." No known fever, abdominal pain, SOB, cough, congestion.   The history is provided by the patient. No language interpreter was used.       Past Medical History:  Diagnosis Date  . Diabetes mellitus without complication [facility])     Patient Active Problem List   Diagnosis Date Noted  . Malingering 03/01/2019  . Adjustment disorder with mixed disturbance of emotions and conduct   . 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 KNEE SURGERY    . WRIST SURGERY       OB History   No obstetric history on file.     Family History  Family history unknown: Yes    Social History   Tobacco Use  . Smoking status: Never Smoker  . Smokeless tobacco: Never Used  Substance Use Topics  . Alcohol use: Not Currently  . Drug use: Not Currently    Types: Cocaine    Home Medications Prior to Admission medications   Medication Sig Start Date End Date Taking? Authorizing Provider  cephALEXin (KEFLEX) 500 MG capsule Take 1 capsule (500 mg total) by mouth 2 (two) times daily for 14 days. 01/14/20 01/28/20  Gareth Morgan, MD  glucose monitoring kit (FREESTYLE) monitoring kit 1 each by Does not apply route as needed for other. 02/26/19   Rancour, Annie Main, MD  metFORMIN (GLUCOPHAGE) 500 MG tablet Take 1 tablet (500 mg total) by  mouth 2 (two) times daily with a meal. 01/16/20   Rolland Porter, MD  ondansetron (ZOFRAN ODT) 4 MG disintegrating tablet Take 1 tablet (4 mg total) by mouth every 8 (eight) hours as needed for nausea or vomiting. 01/14/20   Gareth Morgan, MD    Allergies    5-alpha reductase inhibitors  Review of Systems   Review of Systems  Constitutional: Negative for chills and fever.  HENT: Negative.   Respiratory: Negative.   Cardiovascular: Negative.   Gastrointestinal: Negative.  Negative for nausea and vomiting.  Genitourinary: Negative for dysuria and frequency.       See HPI.  Musculoskeletal: Negative.   Skin: Negative.   Neurological: Negative.     Physical Exam Updated Vital Signs BP 132/76 (BP Location: Right Arm)   Pulse 89   Temp 98 F (36.7 C) (Oral)   Ht '5\' 7"'$  (1.702 m)   Wt 90.7 kg   LMP 01/17/2020   SpO2 100%   BMI 31.32 kg/m   Physical Exam Vitals and nursing note reviewed.  Constitutional:      General: She is not in acute distress.    Appearance: She is well-developed. She is not ill-appearing.  HENT:     Head: Normocephalic.  Cardiovascular:     Rate and Rhythm: Normal rate and regular rhythm.     Heart sounds: No murmur.  Pulmonary:     Effort: Pulmonary effort is normal.     Breath sounds: Normal breath sounds. No wheezing, rhonchi or rales.  Abdominal:     General: Bowel sounds are normal.     Palpations: Abdomen is soft.     Tenderness: There is no abdominal tenderness. There is no guarding or rebound.  Musculoskeletal:        General: Normal range of motion.     Cervical back: Normal range of motion and neck supple.  Skin:    General: Skin is warm and dry.  Neurological:     Mental Status: She is alert and oriented to person, place, and time.     ED Results / Procedures / Treatments   Labs (all labs ordered are listed, but only abnormal results are displayed) Labs Reviewed  BASIC METABOLIC PANEL  URINALYSIS, ROUTINE W REFLEX MICROSCOPIC     EKG None  Radiology No results found.  Procedures Procedures (including critical care time)  Medications Ordered in ED Medications - No data to display  ED Course  I have reviewed the triage vital signs and the nursing notes.  Pertinent labs & imaging results that were available during my care of the patient were reviewed by me and considered in my medical decision making (see chart for details).    MDM Rules/Calculators/A&P                      Patient to ED for her 6th visit in the last 7 days for complaint of hyperglycemia and homelessness. No vomiting.   The patient is well appearing. CBG 469 without evidence of acidosis. Per EMS, drinking a full sugar soda while in transport. She has evidence of an uncomplicated UTI on lab review. Because she has limited resources for obtaining medications, and because it is uncomplicated, Fosfomycin was provided in the ED.   The patient is requesting consultation with SW to obtain resources on housing and medication assistance. Review of chart shows that she has had SW consult in the past but not since last year. I feel it is reasonable to have SW involved given her homelessness, h/o DM unable to get medications.   Of note, the patient is drinking another soda and eating Doritos at bedside. She was noted to have a large quantity of powdered donuts on last visit 5/31. She clearly has not ability to understand management of her diabetes. Could consider referral to mental health services as well.      Final Clinical Impression(s) / ED Diagnoses Final diagnoses:  None   1. Hyperglycemia 2. UTI 3. Homelessness   Rx / DC Orders ED Discharge Orders    None       Charlann Lange, PA-C 01/20/20 5701    Orpah Greek, MD 01/20/20 [phone]

## 2020-01-20 NOTE — Discharge Planning (Signed)
RNCM contacted [facility] to arrange payment of co-pay if needed.  [facility] will notify me of amount owed and [facility] will secure funds for payment.

## 2020-01-20 NOTE — ED Notes (Addendum)
Discharge instructions reviewed with patient.   Patient verbalized understanding to pick up medications and will be free.  Patient given bus pass to get there.   Patient given shirt and pants due to her clothes being coiled.

## 2020-01-20 NOTE — ED Notes (Signed)
Patient ambulatory to restroom with no assistance needed. 

## 2020-01-20 NOTE — ED Notes (Signed)
Pt. Documented in error see above note in chart. 

## 2020-01-21 ENCOUNTER — Emergency Department (HOSPITAL_COMMUNITY)
Admission: EM | Admit: 2020-01-21 | Discharge: 2020-01-21 | Discharge status: Against Medical Advice | Payer: Medicare Other

## 2020-01-21 ENCOUNTER — Other Ambulatory Visit: Payer: Self-pay

## 2020-01-21 ENCOUNTER — Emergency Department (HOSPITAL_COMMUNITY)
Admission: EM | Admit: 2020-01-21 | Discharge: 2020-01-21 | Discharge status: Absent without leave | Payer: Medicare Other

## 2020-01-21 NOTE — ED Notes (Signed)
PT currently not in lobby for triage. Per staff out in lobby PT state she is leaving as she walked out front door

## 2020-01-21 NOTE — ED Notes (Signed)
PT left per front desk staff.

## 2020-01-23 ENCOUNTER — Encounter (HOSPITAL_COMMUNITY): Payer: Self-pay

## 2020-01-23 ENCOUNTER — Emergency Department (HOSPITAL_COMMUNITY)
Admission: EM | Admit: 2020-01-23 | Discharge: 2020-01-24 | Discharge status: Against Medical Advice | Payer: Medicare Other | Attending: Emergency Medicine | Admitting: Emergency Medicine

## 2020-01-23 ENCOUNTER — Other Ambulatory Visit: Payer: Self-pay

## 2020-01-23 DIAGNOSIS — Z0001 Encounter for general adult medical examination with abnormal findings: Secondary | ICD-10-CM | POA: Diagnosis present

## 2020-01-23 DIAGNOSIS — Z79899 Other long term (current) drug therapy: Secondary | ICD-10-CM | POA: Diagnosis not present

## 2020-01-23 DIAGNOSIS — E1165 Type 2 diabetes mellitus with hyperglycemia: Secondary | ICD-10-CM | POA: Insufficient documentation

## 2020-01-23 DIAGNOSIS — Z5321 Procedure and treatment not carried out due to patient leaving prior to being seen by health care provider: Secondary | ICD-10-CM | POA: Insufficient documentation

## 2020-01-23 LAB — CBG MONITORING, ED: Glucose-Capillary: 337 mg/dL — ABNORMAL HIGH (ref 70–99)

## 2020-01-23 NOTE — ED Triage Notes (Signed)
Pt states that she wants some scrub pants, soap, and to get her blood sugar checked.

## 2020-01-24 ENCOUNTER — Other Ambulatory Visit: Payer: Self-pay

## 2020-01-24 ENCOUNTER — Encounter (HOSPITAL_COMMUNITY): Payer: Self-pay

## 2020-01-24 ENCOUNTER — Emergency Department (HOSPITAL_COMMUNITY)
Admission: EM | Admit: 2020-01-24 | Discharge: 2020-01-24 | Discharge status: Against Medical Advice | Payer: Medicare Other | Source: Home / Self Care | Attending: Emergency Medicine | Admitting: Emergency Medicine

## 2020-01-24 DIAGNOSIS — E1165 Type 2 diabetes mellitus with hyperglycemia: Secondary | ICD-10-CM | POA: Diagnosis not present

## 2020-01-24 DIAGNOSIS — R739 Hyperglycemia, unspecified: Secondary | ICD-10-CM

## 2020-01-24 LAB — CBG MONITORING, ED: Glucose-Capillary: 323 mg/dL — ABNORMAL HIGH (ref 70–99)

## 2020-01-24 MED ORDER — SODIUM CHLORIDE 0.9 % IV BOLUS: 1000.0000 mL | Freq: Once | INTRAVENOUS | Status: DC

## 2020-01-24 NOTE — ED Notes (Signed)
Patient ambulated to RR w/o assistance and given clean scrub pants, panties, socks and supplies to wash up with. Instructed to provide a urine sample.

## 2020-01-24 NOTE — ED Triage Notes (Addendum)
Patient states, "I am here to get my Blood sugar checked, a clean pair of pants, and underwear."  CBG in triage-323. Patient was eating a cookie when she came to triage.

## 2020-01-24 NOTE — ED Provider Notes (Signed)
[place] DEPT Provider Note   CSN: [id number] Arrival date & time: 01/24/20  1406     History Chief Complaint  Patient presents with  . wellness check    Dana Noble is a 34 y.o. female with past medical history significant for diabetes presenting to emergency department today with chief complaint of wellness check.  Patient stated in triage was here to get her blood sugar checked a clean pair of pants and underwear.  Denies being in any pain.  She states she is sometimes compliant with her Metformin.  She denies any fever, chills, abdominal pain, urinary frequency, dysuria, polyuria, diarrhea.    Past Medical History:  Diagnosis Date  . Diabetes mellitus without complication [facility])     Patient Active Problem List   Diagnosis Date Noted  . Malingering 03/01/2019  . Adjustment disorder with mixed disturbance of emotions and conduct   . 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 KNEE SURGERY    . WRIST SURGERY       OB History   No obstetric history on file.     Family History  Family history unknown: Yes    Social History   Tobacco Use  . Smoking status: Never Smoker  . Smokeless tobacco: Never Used  Substance Use Topics  . Alcohol use: Not Currently  . Drug use: Not Currently    Types: Cocaine    Home Medications Prior to Admission medications   Medication Sig Start Date End Date Taking? Authorizing Provider  glucose monitoring kit (FREESTYLE) monitoring kit 1 each by Does not apply route as needed for other. 01/20/20   Petrucelli, Samantha R, PA-C  metFORMIN (GLUCOPHAGE) 500 MG tablet Take 1 tablet (500 mg total) by mouth 2 (two) times daily with a meal. 01/20/20   Petrucelli, Samantha R, PA-C    Allergies    5-alpha reductase inhibitors  Review of Systems   Review of Systems All other systems are reviewed and are negative for acute change except as noted in  the HPI.Marland Kitchen Physical Exam Updated Vital Signs BP 124/79 (BP Location: Left Arm)   Pulse 98   Temp 99.4 F (37.4 C) (Oral)   Resp 14   LMP 01/17/2020   SpO2 100%   Physical Exam Vitals and nursing note reviewed.  Constitutional:      General: She is not in acute distress.    Appearance: She is not ill-appearing.  HENT:     Head: Normocephalic and atraumatic.     Right Ear: Tympanic membrane and external ear normal.     Left Ear: Tympanic membrane and external ear normal.     Nose: Nose normal.     Mouth/Throat:     Mouth: Mucous membranes are moist.     Pharynx: Oropharynx is clear.  Eyes:     General: No scleral icterus.       Right eye: No discharge.        Left eye: No discharge.     Extraocular Movements: Extraocular movements intact.     Conjunctiva/sclera: Conjunctivae normal.     Pupils: Pupils are equal, round, and reactive to light.  Neck:     Vascular: No JVD.  Cardiovascular:     Rate and Rhythm: Normal rate and regular rhythm.     Pulses: Normal pulses.          Radial pulses are 2+ on the right side and 2+ on  the left side.     Heart sounds: Normal heart sounds.  Pulmonary:     Comments: Lungs clear to auscultation in all fields. Symmetric chest rise. No wheezing, rales, or rhonchi. Abdominal:     Comments: Abdomen is soft, non-distended, and non-tender in all quadrants. No rigidity, no guarding. No peritoneal signs.  Musculoskeletal:        General: Normal range of motion.     Cervical back: Normal range of motion.  Skin:    General: Skin is warm and dry.     Capillary Refill: Capillary refill takes less than 2 seconds.  Neurological:     Mental Status: She is oriented to person, place, and time.     GCS: GCS eye subscore is 4. GCS verbal subscore is 5. GCS motor subscore is 6.     Comments: Fluent speech, no facial droop.  Psychiatric:        Behavior: Behavior normal.     ED Results / Procedures / Treatments   Labs (all labs ordered are listed,  but only abnormal results are displayed) Labs Reviewed  CBG MONITORING, ED - Abnormal; Notable for the following components:      Result Value   Glucose-Capillary 323 (*)    All other components within normal limits  COMPREHENSIVE METABOLIC PANEL  CBC WITH DIFFERENTIAL/PLATELET  URINALYSIS, ROUTINE W REFLEX MICROSCOPIC  LIPASE, BLOOD  I-STAT BETA HCG BLOOD, ED ([facility], [facility], [facility] ONLY)    EKG None  Radiology No results found.  Procedures Procedures (including critical care time)  Medications Ordered in ED Medications  sodium chloride 0.9 % bolus 1,000 mL (has no administration in time range)    ED Course  I have reviewed the triage vital signs and the nursing notes.  Pertinent labs & imaging results that were available during my care of the patient were reviewed by me and considered in my medical decision making (see chart for details).    MDM Rules/Calculators/A&P                      History provided by patient with additional history obtained from chart review.    Patient seen and examined. Patient presents awake, alert, hemodynamically stable, afebrile, non toxic. CBG 323 in triage.   Patient was in the bathroom for approximately 1 hour.  Staff checked in her multiple times.  Patient came out of the bathroom wearing new clothes and having washed up.  When I went in the exam room she is telling me she is ready to leave.  She states she was only here to wash up.  I recommended that she stay for blood work as she had elevated blood sugar today.  She continues to refuse and is persistent about leaving now.  I have discussed my concerns as pt's  provider and the possibility that this may worsen. I have specifically discussed that without further evaluation I cannot guarantee there is not a life threatening event occuring.  Pt is A&Ox4, her own POA and states understanding of my concerns and the possible consequences.  I have made pt aware that this is an [place] discharge, but they may  return at any time for further evaluation and treatment.   Portions of this note were generated with Lobbyist. Dictation errors may occur despite best attempts at proofreading.   Final Clinical Impression(s) / ED Diagnoses Final diagnoses:  Hyperglycemia    Rx / DC Orders ED Discharge Orders    None  Flint Melter 01/24/20 1644    Lacretia Leigh, MD 01/25/20 1429

## 2020-01-24 NOTE — ED Notes (Signed)
Patient out of restroom. Attempted to stick for blood, IV and fluids. Patient did not like where the RN attempted blood draw and is now refusing lab work. Pateint requesting to leave after she speaks to the PA. Patient states she is homeless, but refuses to tell where she is from/ where she has been staying.

## 2020-01-24 NOTE — ED Notes (Signed)
Patient still in RR washing up.

## 2020-01-24 NOTE — Discharge Instructions (Addendum)
It is very important that you take your Metformin and other home medications.  Return to the emergency department for any new or worsening symptoms

## 2020-01-24 NOTE — ED Notes (Addendum)
Patient is leaving AMA, PA notified and PA updated patient. Requesting clothes, however, she was given some earlier this week and there are no pants in her size. Refuses DC vitals.

## 2020-01-25 ENCOUNTER — Emergency Department (HOSPITAL_COMMUNITY)
Admission: EM | Admit: 2020-01-25 | Discharge: 2020-01-26 | Disposition: A | Discharge status: Home / Self Care | Payer: Medicare Other | Attending: Emergency Medicine | Admitting: Emergency Medicine

## 2020-01-25 ENCOUNTER — Other Ambulatory Visit: Payer: Self-pay

## 2020-01-25 ENCOUNTER — Encounter (HOSPITAL_COMMUNITY): Payer: Self-pay

## 2020-01-25 DIAGNOSIS — E1165 Type 2 diabetes mellitus with hyperglycemia: Secondary | ICD-10-CM | POA: Insufficient documentation

## 2020-01-25 DIAGNOSIS — Z5900 Homelessness unspecified: Secondary | ICD-10-CM

## 2020-01-25 DIAGNOSIS — Z7984 Long term (current) use of oral hypoglycemic drugs: Secondary | ICD-10-CM | POA: Diagnosis not present

## 2020-01-25 DIAGNOSIS — R739 Hyperglycemia, unspecified: Secondary | ICD-10-CM

## 2020-01-25 LAB — CBC
HCT: 36.1 % (ref 36.0–46.0)
Hemoglobin: 11.4 g/dL — ABNORMAL LOW (ref 12.0–15.0)
MCH: 26.6 pg (ref 26.0–34.0)
MCHC: 31.6 g/dL (ref 30.0–36.0)
MCV: 84.3 fL (ref 80.0–100.0)
Platelets: 627 10*3/uL — ABNORMAL HIGH (ref 150–400)
RBC: 4.28 MIL/uL (ref 3.87–5.11)
RDW: 13.6 % (ref 11.5–15.5)
WBC: 7.6 10*3/uL (ref 4.0–10.5)
nRBC: 0 % (ref 0.0–0.2)

## 2020-01-25 LAB — BASIC METABOLIC PANEL
Anion gap: 13 (ref 5–15)
BUN: 6 mg/dL (ref 6–20)
CO2: 26 mmol/L (ref 22–32)
Calcium: 8.7 mg/dL — ABNORMAL LOW (ref 8.9–10.3)
Chloride: 97 mmol/L — ABNORMAL LOW (ref 98–111)
Creatinine, Ser: 0.66 mg/dL (ref 0.44–1.00)
GFR calc Af Amer: >60 mL/min (ref >60)
GFR calc non Af Amer: >60 mL/min (ref >60)
Glucose, Bld: 377 mg/dL — ABNORMAL HIGH (ref 70–99)
Potassium: 4.4 mmol/L (ref 3.5–5.1)
Sodium: 136 mmol/L (ref 135–145)

## 2020-01-25 LAB — I-STAT BETA HCG BLOOD, ED (MC, WL, AP ONLY): I-stat hCG, quantitative: <5 m[IU]/mL (ref <5)

## 2020-01-25 LAB — CBG MONITORING, ED
Glucose-Capillary: 375 mg/dL — ABNORMAL HIGH (ref 70–99)
Glucose-Capillary: 292 mg/dL — ABNORMAL HIGH (ref 70–99)

## 2020-01-25 MED ORDER — SODIUM CHLORIDE 0.9 % IV BOLUS
1000.0000 mL | Freq: Once | INTRAVENOUS | Status: AC
  Administered 2020-01-25: 1000 mL via INTRAVENOUS

## 2020-01-25 MED ORDER — INSULIN ASPART 100 UNIT/ML ~~LOC~~ SOLN
5.0000 [IU] | Freq: Once | SUBCUTANEOUS | Status: AC
  Administered 2020-01-25: 5 [IU] via SUBCUTANEOUS

## 2020-01-25 NOTE — ED Triage Notes (Signed)
Pt BIB [facility] for eval of "not feeling good". Hyperglycemia for EMS, seen at [facility] yesterday for same. Hx of same.

## 2020-01-25 NOTE — Discharge Instructions (Addendum)
Take your medicines as prescribed.  Follow-up with your doctor.  Return to ER if you have fever, vomiting, severe abdominal pain, blood sugar over 500

## 2020-01-25 NOTE — ED Provider Notes (Signed)
[place] EMERGENCY DEPARTMENT Provider Note   CSN: [id number] Arrival date & time: 01/25/20  1946     History Chief Complaint  Patient presents with  . Hyperglycemia    Dana Noble is a 34 y.o. female hx of DM, here presenting with hyperglycemia.  Patient states that she is homeless.  She states that she has nowhere to go and is afraid to go outside in the dark.  She went to [facility] yesterday.  She was seen by provider and immediately wanted to leave.  Patient states that she just has been tired all day.  She denies any fevers or chills or vomiting.  She states that she just wants a place to stay right now.  She denies any suicidal or homicidal ideations or hallucinations.   The history is provided by the patient.       Past Medical History:  Diagnosis Date  . Diabetes mellitus without complication [facility])     Patient Active Problem List   Diagnosis Date Noted  . Malingering 03/01/2019  . Adjustment disorder with mixed disturbance of emotions and conduct   . 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 KNEE SURGERY    . WRIST SURGERY       OB History   No obstetric history on file.     Family History  Family history unknown: Yes    Social History   Tobacco Use  . Smoking status: Never Smoker  . Smokeless tobacco: Never Used  Substance Use Topics  . Alcohol use: Not Currently  . Drug use: Not Currently    Types: Cocaine    Home Medications Prior to Admission medications   Medication Sig Start Date End Date Taking? Authorizing Provider  glucose monitoring kit (FREESTYLE) monitoring kit 1 each by Does not apply route as needed for other. 01/20/20   Petrucelli, Samantha R, PA-C  metFORMIN (GLUCOPHAGE) 500 MG tablet Take 1 tablet (500 mg total) by mouth 2 (two) times daily with a meal. 01/20/20   Petrucelli, Samantha R, PA-C    Allergies    5-alpha reductase  inhibitors  Review of Systems   Review of Systems  Constitutional: Positive for fatigue.  All other systems reviewed and are negative.   Physical Exam Updated Vital Signs BP 124/83 (BP Location: Right Arm)   Pulse 90   Temp 98.6 F (37 C) (Oral)   Resp 14   Ht '5\' 7"'$  (1.702 m)   Wt 91 kg   LMP 01/17/2020   SpO2 100%   BMI 31.42 kg/m   Physical Exam Vitals and nursing note reviewed.  HENT:     Head: Normocephalic.     Nose: Nose normal.     Mouth/Throat:     Mouth: Mucous membranes are dry.  Eyes:     Extraocular Movements: Extraocular movements intact.     Pupils: Pupils are equal, round, and reactive to light.  Cardiovascular:     Rate and Rhythm: Normal rate and regular rhythm.     Pulses: Normal pulses.  Pulmonary:     Effort: Pulmonary effort is normal.  Abdominal:     General: Abdomen is flat.     Palpations: Abdomen is soft.  Musculoskeletal:        General: Normal range of motion.     Cervical back: Normal range of motion.  Skin:    General: Skin is warm.     Capillary  Refill: Capillary refill takes less than 2 seconds.  Neurological:     General: No focal deficit present.     Mental Status: She is alert.  Psychiatric:        Mood and Affect: Mood normal.        Behavior: Behavior normal.     ED Results / Procedures / Treatments   Labs (all labs ordered are listed, but only abnormal results are displayed) Labs Reviewed  BASIC METABOLIC PANEL - Abnormal; Notable for the following components:      Result Value   Chloride 97 (*)    Glucose, Bld 377 (*)    Calcium 8.7 (*)    All other components within normal limits  CBC - Abnormal; Notable for the following components:   Hemoglobin 11.4 (*)    Platelets 627 (*)    All other components within normal limits  CBG MONITORING, ED - Abnormal; Notable for the following components:   Glucose-Capillary 375 (*)    All other components within normal limits  CBG MONITORING, ED - Abnormal; Notable for the  following components:   Glucose-Capillary 292 (*)    All other components within normal limits  URINALYSIS, ROUTINE W REFLEX MICROSCOPIC  I-STAT BETA HCG BLOOD, ED ([facility], [facility], [facility] ONLY)  CBG MONITORING, ED    EKG None  Radiology No results found.  Procedures Procedures (including critical care time)  Medications Ordered in ED Medications  sodium chloride 0.9 % bolus 1,000 mL (1,000 mLs Intravenous New Bag/Given 01/25/20 2230)  insulin aspart (novoLOG) injection 5 Units (5 Units Subcutaneous Given 01/25/20 2217)    ED Course  I have reviewed the triage vital signs and the nursing notes.  Pertinent labs & imaging results that were available during my care of the patient were reviewed by me and considered in my medical decision making (see chart for details).    MDM Rules/Calculators/A&P                     Dana Noble is a 34 y.o. female who presenting with hyperglycemia.  Patient is also homeless and I think that is underlying issue.  Patient has not been taking her medicines as prescribed.  Will check CBC, bmp.  Will hydrate patient and give insulin and recheck blood sugar.  Will consult social work as well.   11:24 PM Labs showed glucose is 377 with normal anion gap.  After IV fluids, her glucose is down to 292.  Social work try to call to find out about shelters and there were no shelters open.  Stable for discharge.   Final Clinical Impression(s) / ED Diagnoses Final diagnoses:  None    Rx / DC Orders ED Discharge Orders    None       Drenda Freeze, MD 01/25/20 2325

## 2020-01-25 NOTE — Social Work (Signed)
Previous to meeting with Pt CSW called several area shelters and was unable to find shelter placement for Pt.  CSW met with Pt at bedside. Pt reports sleeping outside. CSW provided list of homelessness resources as well as the # for [facility].  CSW provided change of clean clothing from social work Management consultant.  CSW also provided sack lunch after conferring with provider due to blood sugar testing.

## 2020-01-26 ENCOUNTER — Other Ambulatory Visit: Payer: Self-pay

## 2020-01-26 ENCOUNTER — Encounter (HOSPITAL_BASED_OUTPATIENT_CLINIC_OR_DEPARTMENT_OTHER): Payer: Self-pay

## 2020-01-26 ENCOUNTER — Emergency Department (HOSPITAL_BASED_OUTPATIENT_CLINIC_OR_DEPARTMENT_OTHER)
Admission: EM | Admit: 2020-01-26 | Discharge: 2020-01-26 | Disposition: A | Discharge status: Home / Self Care | Payer: Medicare Other | Source: Home / Self Care | Attending: Emergency Medicine | Admitting: Emergency Medicine

## 2020-01-26 DIAGNOSIS — E1165 Type 2 diabetes mellitus with hyperglycemia: Secondary | ICD-10-CM | POA: Diagnosis not present

## 2020-01-26 DIAGNOSIS — R739 Hyperglycemia, unspecified: Secondary | ICD-10-CM

## 2020-01-26 LAB — CBC WITH DIFFERENTIAL/PLATELET
Abs Immature Granulocytes: 0.02 10*3/uL (ref 0.00–0.07)
Basophils Absolute: 0 10*3/uL (ref 0.0–0.1)
Basophils Relative: 0 %
Eosinophils Absolute: 0.1 10*3/uL (ref 0.0–0.5)
Eosinophils Relative: 2 %
HCT: 39 % (ref 36.0–46.0)
Hemoglobin: 12.3 g/dL (ref 12.0–15.0)
Immature Granulocytes: 0 %
Lymphocytes Relative: 41 %
Lymphs Abs: 3.2 10*3/uL (ref 0.7–4.0)
MCH: 26.6 pg (ref 26.0–34.0)
MCHC: 31.5 g/dL (ref 30.0–36.0)
MCV: 84.4 fL (ref 80.0–100.0)
Monocytes Absolute: 0.3 10*3/uL (ref 0.1–1.0)
Monocytes Relative: 4 %
Neutro Abs: 4.1 10*3/uL (ref 1.7–7.7)
Neutrophils Relative %: 53 %
Platelets: 626 10*3/uL — ABNORMAL HIGH (ref 150–400)
RBC: 4.62 MIL/uL (ref 3.87–5.11)
RDW: 13.8 % (ref 11.5–15.5)
WBC: 7.8 10*3/uL (ref 4.0–10.5)
nRBC: 0 % (ref 0.0–0.2)

## 2020-01-26 LAB — COMPREHENSIVE METABOLIC PANEL
ALT: 11 U/L (ref 0–44)
AST: 11 U/L — ABNORMAL LOW (ref 15–41)
Albumin: 2.9 g/dL — ABNORMAL LOW (ref 3.5–5.0)
Alkaline Phosphatase: 78 U/L (ref 38–126)
Anion gap: 8 (ref 5–15)
BUN: 8 mg/dL (ref 6–20)
CO2: 26 mmol/L (ref 22–32)
Calcium: 8.6 mg/dL — ABNORMAL LOW (ref 8.9–10.3)
Chloride: 99 mmol/L (ref 98–111)
Creatinine, Ser: 0.64 mg/dL (ref 0.44–1.00)
GFR calc Af Amer: >60 mL/min (ref >60)
GFR calc non Af Amer: >60 mL/min (ref >60)
Glucose, Bld: 331 mg/dL — ABNORMAL HIGH (ref 70–99)
Potassium: 3.8 mmol/L (ref 3.5–5.1)
Sodium: 133 mmol/L — ABNORMAL LOW (ref 135–145)
Total Bilirubin: 0.1 mg/dL — ABNORMAL LOW (ref 0.3–1.2)
Total Protein: 7.6 g/dL (ref 6.5–8.1)

## 2020-01-26 LAB — CBG MONITORING, ED: Glucose-Capillary: 451 mg/dL — ABNORMAL HIGH (ref 70–99)

## 2020-01-26 MED ORDER — METFORMIN HCL 500 MG PO TABS
500.0000 mg | ORAL_TABLET | Freq: Two times a day (BID) | ORAL | 0 refills | Status: DC
Start: 2020-01-26 — End: 2020-03-17

## 2020-01-26 MED ORDER — METFORMIN HCL 500 MG PO TABS
850.0000 mg | ORAL_TABLET | Freq: Once | ORAL | Status: AC
  Administered 2020-01-26: 750 mg via ORAL
  Filled 2020-01-26: qty 2
  Filled 2020-01-26: qty 1

## 2020-01-26 MED ORDER — SODIUM CHLORIDE 0.9 % IV BOLUS
1000.0000 mL | Freq: Once | INTRAVENOUS | Status: AC
  Administered 2020-01-26: 1000 mL via INTRAVENOUS

## 2020-01-26 NOTE — ED Notes (Signed)
Attempted to draw blood on pt in triage , pt jerked needle out of nurses hand and walked out of triage

## 2020-01-26 NOTE — ED Provider Notes (Signed)
[facility] Provider Note   CSN: [id number] Arrival date & time: 01/26/20  2004     History Chief Complaint  Patient presents with  . Hyperglycemia    Dana Noble is a 34 y.o. female.  The history is provided by the patient.  Hyperglycemia Blood sugar level PTA:  400s Severity:  Severe Onset quality:  Gradual Timing:  Constant Progression:  Unchanged Chronicity:  Chronic Diabetes status:  Controlled with oral medications Current diabetic therapy:  Metformin but not taking  Context: noncompliance   Context: not change in medication   Relieved by:  Nothing Ineffective treatments:  None tried Associated symptoms: no abdominal pain, no altered mental status, no blurred vision, no chest pain, no confusion, no dehydration, no diaphoresis, no dizziness, no dysuria, no fatigue, no fever, no increased appetite, no increased thirst, no malaise, no nausea, no polyuria, no shortness of breath, no syncope, no vomiting, no weakness and no weight change   Risk factors: no family hx of diabetes        Past Medical History:  Diagnosis Date  . Diabetes mellitus without complication [facility])     Patient Active Problem List   Diagnosis Date Noted  . Malingering 03/01/2019  . Adjustment disorder with mixed disturbance of emotions and conduct   . 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 KNEE SURGERY    . WRIST SURGERY       OB History   No obstetric history on file.     Family History  Family history unknown: Yes    Social History   Tobacco Use  . Smoking status: Never Smoker  . Smokeless tobacco: Never Used  Substance Use Topics  . Alcohol use: Not Currently  . Drug use: Not Currently    Home Medications Prior to Admission medications   Medication Sig Start Date End Date Taking? Authorizing Provider  glucose monitoring kit (FREESTYLE) monitoring kit 1 each by Does not  apply route as needed for other. 01/20/20   Petrucelli, Samantha R, PA-C  metFORMIN (GLUCOPHAGE) 500 MG tablet Take 1 tablet (500 mg total) by mouth 2 (two) times daily with a meal. 01/26/20   Estefany Goebel, MD    Allergies    5-alpha reductase inhibitors  Review of Systems   Review of Systems  Constitutional: Negative for diaphoresis, fatigue and fever.  HENT: Negative for congestion.   Eyes: Negative for blurred vision and visual disturbance.  Respiratory: Negative for shortness of breath.   Cardiovascular: Negative for chest pain and syncope.  Gastrointestinal: Negative for abdominal pain, nausea and vomiting.  Endocrine: Negative for polydipsia and polyuria.  Genitourinary: Negative for dysuria.  Neurological: Negative for dizziness and weakness.  Psychiatric/Behavioral: Negative for confusion.  All other systems reviewed and are negative.   Physical Exam Updated Vital Signs BP 138/90 (BP Location: Left Arm)   Pulse 89   Temp 98.9 F (37.2 C) (Oral)   Resp 18   LMP  (LMP Unknown)   SpO2 100%   Physical Exam Vitals and nursing note reviewed.  Constitutional:      General: She is not in acute distress.    Appearance: Normal appearance.  HENT:     Head: Normocephalic and atraumatic.     Nose: Nose normal.  Eyes:     Conjunctiva/sclera: Conjunctivae normal.     Pupils: Pupils are equal, round, and reactive to light.  Cardiovascular:     Rate  and Rhythm: Normal rate and regular rhythm.     Pulses: Normal pulses.     Heart sounds: Normal heart sounds.  Pulmonary:     Effort: Pulmonary effort is normal.     Breath sounds: Normal breath sounds.  Abdominal:     General: Abdomen is flat. Bowel sounds are normal.     Tenderness: There is no abdominal tenderness. There is no guarding.  Musculoskeletal:        General: Normal range of motion.     Cervical back: Normal range of motion and neck supple.  Skin:    General: Skin is warm and dry.     Capillary Refill:  Capillary refill takes less than 2 seconds.  Neurological:     General: No focal deficit present.     Mental Status: She is alert and oriented to person, place, and time.     Deep Tendon Reflexes: Reflexes normal.  Psychiatric:        Mood and Affect: Mood normal.        Behavior: Behavior normal.     ED Results / Procedures / Treatments   Labs (all labs ordered are listed, but only abnormal results are displayed) Results for orders placed or performed during the hospital encounter of 01/26/20  CBC with Differential  Result Value Ref Range   WBC 7.8 4.0 - 10.5 K/uL   RBC 4.62 3.87 - 5.11 MIL/uL   Hemoglobin 12.3 12.0 - 15.0 g/dL   HCT 39.0 36.0 - 46.0 %   MCV 84.4 80.0 - 100.0 fL   MCH 26.6 26.0 - 34.0 pg   MCHC 31.5 30.0 - 36.0 g/dL   RDW 13.8 11.5 - 15.5 %   Platelets 626 (H) 150 - 400 K/uL   nRBC 0.0 0.0 - 0.2 %   Neutrophils Relative % 53 %   Neutro Abs 4.1 1.7 - 7.7 K/uL   Lymphocytes Relative 41 %   Lymphs Abs 3.2 0.7 - 4.0 K/uL   Monocytes Relative 4 %   Monocytes Absolute 0.3 0.1 - 1.0 K/uL   Eosinophils Relative 2 %   Eosinophils Absolute 0.1 0.0 - 0.5 K/uL   Basophils Relative 0 %   Basophils Absolute 0.0 0.0 - 0.1 K/uL   Immature Granulocytes 0 %   Abs Immature Granulocytes 0.02 0.00 - 0.07 K/uL  Comprehensive metabolic panel  Result Value Ref Range   Sodium 133 (L) 135 - 145 mmol/L   Potassium 3.8 3.5 - 5.1 mmol/L   [place] 99 98 - 111 mmol/L   CO2 26 22 - 32 mmol/L   Glucose, Bld 331 (H) 70 - 99 mg/dL   BUN 8 6 - 20 mg/dL   Creatinine, Ser 0.64 0.44 - 1.00 mg/dL   Calcium 8.6 (L) 8.9 - 10.3 mg/dL   Total Protein 7.6 6.5 - 8.1 g/dL   Albumin 2.9 (L) 3.5 - 5.0 g/dL   AST 11 (L) 15 - 41 U/L   ALT 11 0 - 44 U/L   Alkaline Phosphatase 78 38 - 126 U/L   Total Bilirubin 0.1 (L) 0.3 - 1.2 mg/dL   GFR calc non Af Amer >60 >60 mL/min   GFR calc Af Amer >60 >60 mL/min   Anion gap 8 5 - 15  POC CBG, ED  Result Value Ref Range   Glucose-Capillary 451 (H)  70 - 99 mg/dL   DG Chest Port 1 View  Result Date: 01/13/2020 CLINICAL DATA:  Fever and vomiting. EXAM: PORTABLE CHEST 1  VIEW COMPARISON:  None. FINDINGS: The cardiac silhouette, mediastinal and hilar contours or within normal limits. Slightly low lung volumes with mild vascular crowding but no infiltrates, edema or effusions. No pulmonary lesions. The bony structures are normal. IMPRESSION: Slightly low lung volumes but no acute pulmonary findings. Electronically Signed   By: Marijo Sanes M.D.   On: 01/13/2020 08:11    EKG None  Radiology No results found.  Procedures Procedures (including critical care time)  Medications Ordered in ED Medications  sodium chloride 0.9 % bolus 1,000 mL (1,000 mLs Intravenous New Bag/Given 01/26/20 2256)  metFORMIN (GLUCOPHAGE) tablet 750 mg (750 mg Oral Given 01/26/20 2342)    ED Course  I have reviewed the triage vital signs and the nursing notes.  Pertinent labs & imaging results that were available during my care of the patient were reviewed by me and considered in my medical decision making (see chart for details).    Hydrated and restarted on meds in the ED.  Follow up with community health and wellness for further care.  No indication for admission at this time.    Dana Noble was evaluated in Emergency Department on 01/26/2020 for the symptoms described in the history of present illness. She was evaluated in the context of the global COVID-19 pandemic, which necessitated consideration that the patient might be at risk for infection with the SARS-CoV-2 virus that causes COVID-19. Institutional protocols and algorithms that pertain to the evaluation of patients at risk for COVID-19 are in a state of rapid change based on information released by regulatory bodies including the CDC and federal and state organizations. These policies and algorithms were followed during the patient's care in the ED.  Final Clinical Impression(s) / ED Diagnoses Final  diagnoses:  Hyperglycemia   Return for intractable cough, coughing up blood,fevers >100.4 unrelieved by medication, shortness of breath, intractable vomiting, chest pain, shortness of breath, weakness,numbness, changes in speech, facial asymmetry,abdominal pain, passing out,Inability to tolerate liquids or food, cough, altered mental status or any concerns. No signs of systemic illness or infection. The patient is nontoxic-appearing on exam and vital signs are within normal limits.   I have reviewed the triage vital signs and the nursing notes. Pertinent labs &imaging results that were available during my care of the patient were reviewed by me and considered in my medical decision making (see chart for details).After history, exam, and medical workup I feel the patient has beenappropriately medically screened and is safe for discharge home. Pertinent diagnoses were discussed with the patient. Patient was given return precautions.    Rx / DC Orders ED Discharge Orders         Ordered    metFORMIN (GLUCOPHAGE) 500 MG tablet  2 times daily with meals     01/26/20 2352           Johnanna Bakke, MD 01/26/20 2355

## 2020-01-26 NOTE — ED Notes (Signed)
Security called to escort pt to lobby for discharge. Pt stating she is still packing up her things. Pt cussing and packing her things. Security standing by. Pt ambulatory independently.

## 2020-01-26 NOTE — ED Triage Notes (Addendum)
[facility] report-pt was at bus stop [employer] x 4 hours-[employer] was called to escort pt off property-pt c/o "diabetic issues"-[facility] was called to transport pt to ED-pt with hx noncompliant DM-pt NAD-steady gait-denies pain c/o

## 2020-01-26 NOTE — ED Notes (Signed)
  Patient states [place] and asked to leave because she was sleeping.  Patient states [employer] and EMS was called, police officer stated she could not be arrested because she technically had not broken a law so EMS offered to bring patient to [facility] to be checked out.  Patient states she does not have any pain but has not been able to get into a shelter.  Patient states she has been sleeping outside a lot and would like help getting into a shelter.  Patient stated she just wanted to sleep but agreed to labs and fluids.

## 2020-01-26 NOTE — ED Notes (Signed)
Pt was allowed to wash up and get dressed in the room. Upon checking on pt she is still not dressed and states she feels uncomfortable with the door cracked and she needs some privacy. Pt informed she needs to get dressed or security will be called to be escorted out.

## 2020-01-26 NOTE — ED Notes (Signed)
Pt verbalizes understanding of d/c instructions. Pt ambulatory at d/c with all belongings.  Pt requesting to clean self up with wash cloths before departure. Pt walking into hallways with no clothes on and wrapped in a blanket - redirected back into room by this RN.

## 2020-01-26 NOTE — ED Notes (Signed)
refused BP

## 2020-01-29 ENCOUNTER — Encounter (HOSPITAL_COMMUNITY): Payer: Self-pay | Admitting: Emergency Medicine

## 2020-01-29 ENCOUNTER — Emergency Department (HOSPITAL_COMMUNITY)
Admission: EM | Admit: 2020-01-29 | Discharge: 2020-01-29 | Disposition: A | Discharge status: Home / Self Care | Payer: Medicare Other | Attending: Emergency Medicine | Admitting: Emergency Medicine

## 2020-01-29 DIAGNOSIS — R110 Nausea: Secondary | ICD-10-CM | POA: Diagnosis present

## 2020-01-29 DIAGNOSIS — Z7984 Long term (current) use of oral hypoglycemic drugs: Secondary | ICD-10-CM | POA: Diagnosis not present

## 2020-01-29 DIAGNOSIS — R519 Headache, unspecified: Secondary | ICD-10-CM | POA: Diagnosis not present

## 2020-01-29 DIAGNOSIS — E1165 Type 2 diabetes mellitus with hyperglycemia: Secondary | ICD-10-CM | POA: Diagnosis not present

## 2020-01-29 DIAGNOSIS — R739 Hyperglycemia, unspecified: Secondary | ICD-10-CM

## 2020-01-29 LAB — COMPREHENSIVE METABOLIC PANEL
ALT: 13 U/L (ref 0–44)
AST: 15 U/L (ref 15–41)
Albumin: 2.9 g/dL — ABNORMAL LOW (ref 3.5–5.0)
Alkaline Phosphatase: 75 U/L (ref 38–126)
Anion gap: 11 (ref 5–15)
BUN: 6 mg/dL (ref 6–20)
CO2: 25 mmol/L (ref 22–32)
Calcium: 8.7 mg/dL — ABNORMAL LOW (ref 8.9–10.3)
Chloride: 100 mmol/L (ref 98–111)
Creatinine, Ser: 0.68 mg/dL (ref 0.44–1.00)
GFR calc Af Amer: >60 mL/min (ref >60)
GFR calc non Af Amer: >60 mL/min (ref >60)
Glucose, Bld: 352 mg/dL — ABNORMAL HIGH (ref 70–99)
Potassium: 3.5 mmol/L (ref 3.5–5.1)
Sodium: 136 mmol/L (ref 135–145)
Total Bilirubin: 0.6 mg/dL (ref 0.3–1.2)
Total Protein: 7.3 g/dL (ref 6.5–8.1)

## 2020-01-29 LAB — CBC WITH DIFFERENTIAL/PLATELET
Abs Immature Granulocytes: 0.03 10*3/uL (ref 0.00–0.07)
Basophils Absolute: 0 10*3/uL (ref 0.0–0.1)
Basophils Relative: 0 %
Eosinophils Absolute: 0.1 10*3/uL (ref 0.0–0.5)
Eosinophils Relative: 1 %
HCT: 37.4 % (ref 36.0–46.0)
Hemoglobin: 11.7 g/dL — ABNORMAL LOW (ref 12.0–15.0)
Immature Granulocytes: 0 %
Lymphocytes Relative: 36 %
Lymphs Abs: 3.1 10*3/uL (ref 0.7–4.0)
MCH: 26.7 pg (ref 26.0–34.0)
MCHC: 31.3 g/dL (ref 30.0–36.0)
MCV: 85.4 fL (ref 80.0–100.0)
Monocytes Absolute: 0.5 10*3/uL (ref 0.1–1.0)
Monocytes Relative: 6 %
Neutro Abs: 4.9 10*3/uL (ref 1.7–7.7)
Neutrophils Relative %: 57 %
Platelets: 573 10*3/uL — ABNORMAL HIGH (ref 150–400)
RBC: 4.38 MIL/uL (ref 3.87–5.11)
RDW: 13.7 % (ref 11.5–15.5)
WBC: 8.6 10*3/uL (ref 4.0–10.5)
nRBC: 0 % (ref 0.0–0.2)

## 2020-01-29 LAB — CBG MONITORING, ED: Glucose-Capillary: 338 mg/dL — ABNORMAL HIGH (ref 70–99)

## 2020-01-29 MED ORDER — ONDANSETRON 4 MG PO TBDP
4.0000 mg | ORAL_TABLET | Freq: Once | ORAL | Status: AC
  Administered 2020-01-29: 4 mg via ORAL
  Filled 2020-01-29: qty 1

## 2020-01-29 MED ORDER — ONDANSETRON 4 MG PO TBDP: 4.0000 mg | ORAL_TABLET | Freq: Three times a day (TID) | ORAL | 0 refills | Status: AC | PRN

## 2020-01-29 MED ORDER — ACETAMINOPHEN 325 MG PO TABS
650.0000 mg | ORAL_TABLET | Freq: Once | ORAL | Status: AC
  Administered 2020-01-29: 650 mg via ORAL
  Filled 2020-01-29: qty 2

## 2020-01-29 NOTE — Discharge Instructions (Addendum)
You were seen in the emergency department today for nausea, not feeling well, and a headache.  Your blood work showed that your blood sugar is high, please be sure to take your medications as prescribed, follow a low-carb diet, and track this at home.  We are sending you home with Zofran to take as needed every 8 hours for nausea and vomiting. We have prescribed you new medication(s) today. Discuss the medications prescribed today with your pharmacist as they can have adverse effects and interactions with your other medicines including over the counter and prescribed medications. Seek medical evaluation if you start to experience new or abnormal symptoms after taking one of these medicines, seek care immediately if you start to experience difficulty breathing, feeling of your throat closing, facial swelling, or rash as these could be indications of a more serious allergic reaction  Motrin or Tylenol per over-the-counter dosing to help with any continued return of discomfort.  Please follow-up with your primary care provider within 3 days.  Return to the ED for any new or worsening symptoms including but not limited to fever, inability to keep fluids down, neck stiffness, numbness, weakness, change in your vision, passing out, or worsening pain, or any other concerns.

## 2020-01-29 NOTE — ED Triage Notes (Signed)
Patient reports nausea with mild headache this morning .

## 2020-01-29 NOTE — ED Provider Notes (Signed)
[facility] EMERGENCY DEPARTMENT Provider Note   CSN: [id number] Arrival date & time: 01/29/20  0424     History Chief Complaint  Patient presents with  . Nausea    Dana Noble is a 34 y.o. female with a history of diabetes mellitus and malingering who presents to the emergency department with complaints of nausea & generally not feeling well began shortly PTA. She states that she feels nauseous, somewhat weak, and has a very mild headache. Headache had gradual onset, steady progression, similar to prior. No alleviating/aggravating factors to her sxs. No intervention PTA.  Denies fever, chills, visual disturbance, numbness, focal weakness, abdominal pain, dysuria, chest pain, dyspnea, or syncope.  Patient states that she just needs to rest.  HPI     Past Medical History:  Diagnosis Date  . Diabetes mellitus without complication [facility])     Patient Active Problem List   Diagnosis Date Noted  . Malingering 03/01/2019  . Adjustment disorder with mixed disturbance of emotions and conduct   . 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 KNEE SURGERY    . WRIST SURGERY       OB History   No obstetric history on file.     Family History  Family history unknown: Yes    Social History   Tobacco Use  . Smoking status: Never Smoker  . Smokeless tobacco: Never Used  Vaping Use  . Vaping Use: Never used  Substance Use Topics  . Alcohol use: Not Currently  . Drug use: Not Currently    Home Medications Prior to Admission medications   Medication Sig Start Date End Date Taking? Authorizing Provider  glucose monitoring kit (FREESTYLE) monitoring kit 1 each by Does not apply route as needed for other. 01/20/20   Corrado Hymon R, PA-C  metFORMIN (GLUCOPHAGE) 500 MG tablet Take 1 tablet (500 mg total) by mouth 2 (two) times daily with a meal. 01/26/20   Palumbo, April, MD    Allergies      5-alpha reductase inhibitors  Review of Systems   Review of Systems  Constitutional: Positive for fatigue. Negative for chills and fever.  Eyes: Negative for visual disturbance.  Respiratory: Negative for shortness of breath.   Gastrointestinal: Positive for nausea. Negative for abdominal pain, anal bleeding, blood in stool, constipation, diarrhea and vomiting.  Genitourinary: Negative for dysuria.  Neurological: Positive for headaches. Negative for dizziness, seizures, syncope, facial asymmetry, speech difficulty, weakness (Focal), light-headedness and numbness.  All other systems reviewed and are negative.   Physical Exam Updated Vital Signs BP (!) 129/102 (BP Location: Left Arm)   Pulse 89   Temp 98.4 F (36.9 C) (Oral)   Resp 16   LMP  (LMP Unknown)   SpO2 97%   Physical Exam Vitals and nursing note reviewed.  Constitutional:      General: She is not in acute distress.    Appearance: She is well-developed. She is not toxic-appearing.  HENT:     Head: Normocephalic and atraumatic.  Eyes:     General: Vision grossly intact. Gaze aligned appropriately.        Right eye: No discharge.        Left eye: No discharge.     Extraocular Movements: Extraocular movements intact.     Conjunctiva/sclera: Conjunctivae normal.     Comments: PERRL. No proptosis.   Cardiovascular:     Rate and Rhythm: Normal rate and regular rhythm.  Pulmonary:     Effort: Pulmonary effort is normal. No respiratory distress.     Breath sounds: Normal breath sounds. No wheezing, rhonchi or rales.  Abdominal:     General: There is no distension.     Palpations: Abdomen is soft.     Tenderness: There is no abdominal tenderness. There is no guarding or rebound.  Musculoskeletal:     Cervical back: Normal range of motion and neck supple. No rigidity.  Skin:    General: Skin is warm and dry.     Findings: No rash.  Neurological:     Comments: Alert. Clear speech. No facial droop. CNIII-XII grossly  intact. Bilateral upper and lower extremities' sensation grossly intact. 5/5 symmetric strength with grip strength and with plantar and dorsi flexion bilaterally . Normal finger to nose bilaterally. Negative pronator drift. Negative Romberg sign. Gait is steady and intact.   Psychiatric:        Behavior: Behavior normal.    ED Results / Procedures / Treatments   Labs (all labs ordered are listed, but only abnormal results are displayed) Labs Reviewed  CBC WITH DIFFERENTIAL/PLATELET - Abnormal; Notable for the following components:      Result Value   Hemoglobin 11.7 (*)    Platelets 573 (*)    All other components within normal limits  COMPREHENSIVE METABOLIC PANEL - Abnormal; Notable for the following components:   Glucose, Bld 352 (*)    Calcium 8.7 (*)    Albumin 2.9 (*)    All other components within normal limits  CBG MONITORING, ED - Abnormal; Notable for the following components:   Glucose-Capillary 338 (*)    All other components within normal limits  I-STAT BETA HCG BLOOD, ED ([facility], [facility], [facility] ONLY)    EKG None  Radiology No results found.  Procedures Procedures (including critical care time)  Medications Ordered in ED Medications  ondansetron (ZOFRAN-ODT) disintegrating tablet 4 mg (4 mg Oral Given 01/29/20 0626)  acetaminophen (TYLENOL) tablet 650 mg (650 mg Oral Given 01/29/20 1856)    ED Course  I have reviewed the triage vital signs and the nursing notes.  Pertinent labs & imaging results that were available during my care of the patient were reviewed by me and considered in my medical decision making (see chart for details).    MDM Rules/Calculators/A&P                         Patient presents with complaint of nausea, generally not feeling well, and headache. Patient is nontoxic appearing, vitals without significant abnormality.  Has a benign physical exam.   Additional history obtained from chart review and nursing note reviewed. Patient has frequent ED  visits. I have reviewed and interpreted labs including: CBC: Mild anemia, & thrombocytosis similar to prior. No leukocytosis. CMP: Hyperglycemia without acidosis or anion gap elevation. No significant electrolyte derangement.   I have ordered zofran & tylenol for symptomatic management.   Patient has hx of similar headaches, gradual onset with steady progression in severity, she has no focal neurologic deficits, fever, nuchal rigidity, dizziness, disturbance, or proptosis-overall low suspicion for [facility], ICH, ischemic CVA, dural venous sinus thrombosis, acute glaucoma, giant cell arteritis, mass, or meningitis.  Labs appear consistent with her baseline.  She does not appear to be in DKA.  06:55: RE-EVAL: Patient woken from sleep, feeling somewhat improved, tolerating PO. Appears appropriate for discharge at this time. I discussed treatment plan, need for PCP follow-up, and  return precautions with the patient. Provided opportunity for questions, patient confirmed understanding and is in agreement with plan.   Final Clinical Impression(s) / ED Diagnoses Final diagnoses:  Hyperglycemia    Rx / DC Orders ED Discharge Orders    None       Amaryllis Dyke, PA-C 01/29/20 0701    Ward, Delice Bison, DO 01/29/20 [phone]

## 2020-01-30 ENCOUNTER — Encounter (HOSPITAL_COMMUNITY): Payer: Self-pay | Admitting: Emergency Medicine

## 2020-01-30 ENCOUNTER — Emergency Department (HOSPITAL_COMMUNITY)
Admission: EM | Admit: 2020-01-30 | Discharge: 2020-01-30 | Disposition: A | Discharge status: Home / Self Care | Payer: Medicare Other | Attending: Emergency Medicine | Admitting: Emergency Medicine

## 2020-01-30 DIAGNOSIS — Z5900 Homelessness unspecified: Secondary | ICD-10-CM

## 2020-01-30 DIAGNOSIS — F4325 Adjustment disorder with mixed disturbance of emotions and conduct: Secondary | ICD-10-CM | POA: Diagnosis not present

## 2020-01-30 DIAGNOSIS — Z9114 Patient's other noncompliance with medication regimen: Secondary | ICD-10-CM | POA: Insufficient documentation

## 2020-01-30 DIAGNOSIS — Z7984 Long term (current) use of oral hypoglycemic drugs: Secondary | ICD-10-CM | POA: Insufficient documentation

## 2020-01-30 DIAGNOSIS — R739 Hyperglycemia, unspecified: Secondary | ICD-10-CM

## 2020-01-30 LAB — CBG MONITORING, ED: Glucose-Capillary: 406 mg/dL — ABNORMAL HIGH (ref 70–99)

## 2020-01-30 MED ORDER — SODIUM CHLORIDE 0.9 % IV BOLUS: 1000.0000 mL | Freq: Once | INTRAVENOUS | Status: DC

## 2020-01-30 NOTE — TOC Initial Note (Signed)
Transition of Care [facility]) - Initial/Assessment Note    Patient Details  Name: Dana Noble MRN: [id number] Date of Birth: Aug 21, 1985  [facility]) CM/SW Contact:    Lockie Pares, RN Phone Number: 01/30/2020, 4:38 PM  Clinical Narrative:                 Asked to review patient, homeless found lying near bus-stop, patient stated blood sugar high, someone called 911.  Patient was in ED yesterday for the same things, and last week as well. Referred to community resources, did not follow up. Continue to refer to community resources that are there to assist her. She needs to follow to get a PCP,to get her medications and insulin so her blood sugar will be maintained She has already utilized [facility] program, therefore is ineligible, and now that she has insurance, is ineligible defacto..    Barriers to Discharge: Homeless with medical needs   Patient Goals and CMS Choice        Expected Discharge Plan and Services   Shelter and resources are available, but must be planned ahead of time. Please see patient instructions.                                             Prior Living Arrangements/Services    Homeless                   Activities of Daily Living      Permission Sought/Granted                  Emotional Assessment              Admission diagnosis:  elevated blood sugar Patient Active Problem List   Diagnosis Date Noted  . Malingering 03/01/2019  . Adjustment disorder with mixed disturbance of emotions and conduct   . Cocaine-induced mood disorder ([facility]) 02/19/2019   PCP:  Patient, No Pcp Per Pharmacy:   [id number] - [place], [place] - [address] AT [facility] [address] [place] [address] Phone: [phone] Fax: [phone]  [facility] - [place], [place] - [address] [place] [address] Phone: [phone] Fax:  [phone]     Social Determinants of Health (SDOH) Interventions    Readmission Risk Interventions No flowsheet data found.

## 2020-01-30 NOTE — ED Provider Notes (Signed)
[facility] DEPT Provider Note   CSN: [id number] Arrival date & time: 01/30/20  1454     History Chief Complaint  Patient presents with  . Hyperglycemia  . social work consult    homeless and wants help with shelter/housing placement    Dana Noble is a 34 y.o. female with history of diabetes mellitus, substance abuse presenting requesting resources for shelter.  Per triage note the patient was found laying on the ground asleep at a bus stop and a bystander called 911.  She was noted to be hyperglycemic with a CBG of 442.  She has frequent ED visits for hyperglycemia and homeless-related complaints, most recently was seen last night.  She states she has not been compliant with her Metformin and states "I just said f*ck it to the medicines".  When I informed her that she was hyperglycemic and that she would benefit from IV fluids and her medications she refused and states "I just want to talk to a social worker so that I can go to a shelter.  I am also interested in rehab.  I have not had a shower in several days.  I am not trying to make a name for myself here."  She states that she has a history of crack cocaine use, last used 1 week ago.  She denies suicidal ideation or homicidal ideation.  She denies any physical complaints including fever, nausea, vomiting, chest pain, shortness of breath, abdominal pain, or urinary symptoms.    The history is provided by the patient and medical records.       Past Medical History:  Diagnosis Date  . Diabetes mellitus without complication [facility])     Patient Active Problem List   Diagnosis Date Noted  . Malingering 03/01/2019  . Adjustment disorder with mixed disturbance of emotions and conduct   . 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 KNEE SURGERY    . WRIST SURGERY       OB History   No obstetric history on file.     Family History    Family history unknown: Yes    Social History   Tobacco Use  . Smoking status: Never Smoker  . Smokeless tobacco: Never Used  Vaping Use  . Vaping Use: Never used  Substance Use Topics  . Alcohol use: Not Currently  . Drug use: Not Currently    Home Medications Prior to Admission medications   Medication Sig Start Date End Date Taking? Authorizing Provider  glucose monitoring kit (FREESTYLE) monitoring kit 1 each by Does not apply route as needed for other. 01/20/20   Petrucelli, Samantha R, PA-C  metFORMIN (GLUCOPHAGE) 500 MG tablet Take 1 tablet (500 mg total) by mouth 2 (two) times daily with a meal. 01/26/20   Palumbo, April, MD  ondansetron (ZOFRAN ODT) 4 MG disintegrating tablet Take 1 tablet (4 mg total) by mouth every 8 (eight) hours as needed for nausea or vomiting. 01/29/20   Petrucelli, Samantha R, PA-C    Allergies    5-alpha reductase inhibitors  Review of Systems   Review of Systems  Constitutional: Negative for fever.  Respiratory: Negative for shortness of breath.   Cardiovascular: Negative for chest pain.  Gastrointestinal: Negative for abdominal pain, nausea and vomiting.  Psychiatric/Behavioral: Negative for suicidal ideas.  All other systems reviewed and are negative.   Physical Exam Updated Vital Signs BP 112/78 (BP Location: Left Arm)  Pulse 99   Temp 98.5 F (36.9 C) (Oral)   Resp 18   LMP  (LMP Unknown)   SpO2 99%   Physical Exam Vitals and nursing note reviewed.  Constitutional:      General: She is not in acute distress.    Appearance: She is well-developed.  HENT:     Head: Normocephalic and atraumatic.  Eyes:     General:        Right eye: No discharge.        Left eye: No discharge.     Conjunctiva/sclera: Conjunctivae normal.  Neck:     Vascular: No JVD.     Trachea: No tracheal deviation.  Cardiovascular:     Rate and Rhythm: Normal rate and regular rhythm.  Pulmonary:     Effort: Pulmonary effort is normal.     Breath  sounds: Normal breath sounds.  Abdominal:     General: Bowel sounds are normal. There is no distension.     Palpations: Abdomen is soft.     Tenderness: There is no abdominal tenderness. There is no guarding or rebound.  Skin:    Findings: No erythema.  Neurological:     Mental Status: She is alert and oriented to person, place, and time.  Psychiatric:        Behavior: Behavior is hyperactive.        Thought Content: Thought content does not include homicidal or suicidal ideation. Thought content does not include homicidal or suicidal plan.     Comments: Does not appear to be responding to internal stimuli     ED Results / Procedures / Treatments   Labs (all labs ordered are listed, but only abnormal results are displayed) Labs Reviewed  CBG MONITORING, ED - Abnormal; Notable for the following components:      Result Value   Glucose-Capillary 406 (*)    All other components within normal limits    EKG None  Radiology No results found.  Procedures Procedures (including critical care time)  Medications Ordered in ED Medications - No data to display  ED Course  I have reviewed the triage vital signs and the nursing notes.  Pertinent labs & imaging results that were available during my care of the patient were reviewed by me and considered in my medical decision making (see chart for details).    MDM Rules/Calculators/A&P                          Patient presenting requesting assistance with placement at the shelter or rehab facility for drug use.  She is afebrile, vital signs are stable.  She appears somewhat anxious, fidgeting frequently.  She is requesting a place to shower.  She is found to be hyperglycemic and was offered IV fluids and I recommended obtaining an i-STAT Chem-8 to rule out DKA but she refused both of these interventions and states "I just want to talk to the social worker".  Clinically she appears well and she was seen less than 24 hours ago at [facility] where she was found to be hyperglycemic but not in DKA.  She denies any other complaints at this time.  Social work was consulted and provided the patient with outpatient resources that have been provided to her previously.  It is unfortunate that the patient does not have a government ID and so is unable to go to many of the local shelters but social work recommended going to  a local church for further recommendations.  Discussed recommendations with patient who verbalized understanding of and agreement with plan and is stable for discharge at this time.   Final Clinical Impression(s) / ED Diagnoses Final diagnoses:  Homeless  Hyperglycemia  Noncompliance with medications    Rx / DC Orders ED Discharge Orders    None       Debroah Baller 01/30/20 2104    Wyvonnia Dusky, MD 01/31/20 1320

## 2020-01-30 NOTE — ED Triage Notes (Signed)
Per [facility] pt was laying on the ground a sleep at bus stop and by stander called 911. Pt states that her sugar is high-CBG 442. Pt not compliant with her medications. Pt reports that wants to see a counselor for homeless shelter. Vitals: 124/82, 100HR, 18R, 98% on RA, 98.5 temp.

## 2020-01-30 NOTE — Discharge Instructions (Signed)
Our social worker has provided you with resources for outpatient follow-up.  I have consulted our Peer Support Specialists for assistance with drug rehab.   Our social worker recommends seeking shelter at a church tonight but you will need to obtain an ID in order to stay at most of the other shelters in the area.  It will be very important to take your diabetes medications so as to avoid any long-term health issues related to diabetes. High blood sugars can make you very sick.   Return to the emergency department if any concerning signs or symptoms develop.

## 2020-01-31 ENCOUNTER — Encounter (HOSPITAL_COMMUNITY): Payer: Self-pay | Admitting: Emergency Medicine

## 2020-01-31 ENCOUNTER — Other Ambulatory Visit: Payer: Self-pay

## 2020-01-31 ENCOUNTER — Emergency Department (HOSPITAL_COMMUNITY)
Admission: EM | Admit: 2020-01-31 | Discharge: 2020-01-31 | Disposition: A | Discharge status: Home / Self Care | Payer: Medicare Other | Attending: Emergency Medicine | Admitting: Emergency Medicine

## 2020-01-31 DIAGNOSIS — Z7984 Long term (current) use of oral hypoglycemic drugs: Secondary | ICD-10-CM | POA: Insufficient documentation

## 2020-01-31 DIAGNOSIS — Z5900 Homelessness unspecified: Secondary | ICD-10-CM

## 2020-01-31 DIAGNOSIS — E119 Type 2 diabetes mellitus without complications: Secondary | ICD-10-CM | POA: Insufficient documentation

## 2020-01-31 HISTORY — DX: Other psychoactive substance abuse, uncomplicated: F19.10

## 2020-01-31 NOTE — Progress Notes (Signed)
TOC CM spoke to pt and provided information for [facility], and [facility]. States she is familiar with [facility] and their services. Pt has insurance coverage but does not have PCP. She wants to go to shelter in [facility]. Contacted [facility] and they have bed availability. Pt signed [employer] form, scanned to transportation email. ED provider updated. Isidoro Donning RN CCM, [facility] ED TOC CM [phone]

## 2020-01-31 NOTE — ED Provider Notes (Signed)
[facility] DEPT Provider Note   CSN: [id number] Arrival date & time: 01/31/20  1153     History Chief Complaint  Patient presents with  . Homeless    Dana Noble is a 34 y.o. female.  34 y.o female with a PMH of DM and substance abuse presents to the ED requesting shelter resources.  Patient was evaluated for the 16th time today requesting resources.  She reports she has been clean out of crack for the past 3 weeks, has not consumed any other illicit drug. She reports attempting to obtain resources yesterday however was unable to do so.  And is requesting to speak to social worker 1 more time. No medical complaints on today's visit.   The history is provided by the patient.       Past Medical History:  Diagnosis Date  . Diabetes mellitus without complication ([place])   . Substance abuse [facility])     Patient Active Problem List   Diagnosis Date Noted  . Malingering 03/01/2019  . Adjustment disorder with mixed disturbance of emotions and conduct   . 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 KNEE SURGERY    . WRIST SURGERY       OB History   No obstetric history on file.     Family History  Family history unknown: Yes    Social History   Tobacco Use  . Smoking status: Never Smoker  . Smokeless tobacco: Never Used  Vaping Use  . Vaping Use: Never used  Substance Use Topics  . Alcohol use: Not Currently  . Drug use: Not Currently    Home Medications Prior to Admission medications   Medication Sig Start Date End Date Taking? Authorizing Provider  glucose monitoring kit (FREESTYLE) monitoring kit 1 each by Does not apply route as needed for other. 01/20/20   Petrucelli, Samantha R, PA-C  metFORMIN (GLUCOPHAGE) 500 MG tablet Take 1 tablet (500 mg total) by mouth 2 (two) times daily with a meal. 01/26/20   Palumbo, April, MD  ondansetron (ZOFRAN ODT) 4 MG disintegrating  tablet Take 1 tablet (4 mg total) by mouth every 8 (eight) hours as needed for nausea or vomiting. 01/29/20   Petrucelli, Samantha R, PA-C    Allergies    5-alpha reductase inhibitors  Review of Systems   Review of Systems  Constitutional: Negative for fever.  Neurological: Negative for headaches.    Physical Exam Updated Vital Signs BP 124/80   Pulse (!) 101   Temp 98.5 F (36.9 C) (Oral)   Resp 18   Ht '5\' 7"'$  (1.702 m)   Wt 90 kg   LMP  (LMP Unknown)   SpO2 100%   BMI 31.08 kg/m   Physical Exam Vitals and nursing note reviewed.  Constitutional:      Appearance: She is not ill-appearing or toxic-appearing.  HENT:     Head: Normocephalic and atraumatic.     Mouth/Throat:     Mouth: Mucous membranes are moist.  Eyes:     Pupils: Pupils are equal, round, and reactive to light.  Cardiovascular:     Rate and Rhythm: Normal rate.  Pulmonary:     Effort: Pulmonary effort is normal.  Abdominal:     General: Abdomen is flat.     Palpations: Abdomen is soft.     Tenderness: There is no abdominal tenderness.  Musculoskeletal:     Cervical back: Normal range of  motion and neck supple.  Skin:    General: Skin is warm and dry.  Neurological:     Mental Status: She is alert and oriented to person, place, and time.  Psychiatric:        Speech: Speech is tangential. Speech is not delayed.        Behavior: Behavior is hyperactive.     ED Results / Procedures / Treatments   Labs (all labs ordered are listed, but only abnormal results are displayed) Labs Reviewed - No data to display  EKG None  Radiology No results found.  Procedures Procedures (including critical care time)  Medications Ordered in ED Medications - No data to display  ED Course  I have reviewed the triage vital signs and the nursing notes.  Pertinent labs & imaging results that were available during my care of the patient were reviewed by me and considered in my medical decision making (see  chart for details).    MDM Rules/Calculators/A&P   Patient with a history of diabetes, substance abuse requesting resources for shelter placement.  According to patient she has been staying at the [facility], states she wakes up too late in order to have a proper shower.  She reports "I am just tired of all of this and need help ".  She reports has been clean from crack for the past 3 weeks, has not had any other illicit drug use then.  She is a diabetic, is currently eating a box of Oreos at the bedside.  Patient states "can I just please talk to a Education officer, museum ".  She is finding any medical intervention at this time for her blood sugar, states that she just needs a place to live.  No medical complaints such as fever, nausea, chest pain, shortness of breath or other complaints.  1:38 PM call placed to social work for further recommendations.  2:57 PM [name] RN from social work will IT consultant in order to provide patient with shelter information to [employer]. Patient will be advised to wait in [place] in order to get in contact with shelter.    Portions of this note were generated with Lobby[employer]. Dictation errors may occur despite best attempts at proofreading.  Final Clinical Impression(s) / ED Diagnoses Final diagnoses:  Homelessness    Rx / DC Orders ED Discharge Orders    None       [name], PA-C 01/31/20 1459    Dorie Rank, MD 02/02/20 1037

## 2020-01-31 NOTE — ED Triage Notes (Signed)
Patient presents with requests to talk to a Child psychotherapist. She would like help to get into a shelter in [facility]. She states she is now three weeks clean of crack cocaine. She now would like help to find resources, a place to sleep and get back on her feet.

## 2020-03-15 ENCOUNTER — Other Ambulatory Visit: Payer: Self-pay

## 2020-03-15 ENCOUNTER — Emergency Department (HOSPITAL_COMMUNITY)
Admission: EM | Admit: 2020-03-15 | Discharge: 2020-03-16 | Disposition: A | Discharge status: Home / Self Care | Payer: Medicare Other | Attending: Emergency Medicine | Admitting: Emergency Medicine

## 2020-03-15 DIAGNOSIS — R440 Auditory hallucinations: Secondary | ICD-10-CM | POA: Diagnosis present

## 2020-03-15 DIAGNOSIS — F209 Schizophrenia, unspecified: Secondary | ICD-10-CM

## 2020-03-15 DIAGNOSIS — R441 Visual hallucinations: Secondary | ICD-10-CM | POA: Insufficient documentation

## 2020-03-15 DIAGNOSIS — N390 Urinary tract infection, site not specified: Secondary | ICD-10-CM | POA: Diagnosis not present

## 2020-03-15 DIAGNOSIS — Z79899 Other long term (current) drug therapy: Secondary | ICD-10-CM | POA: Insufficient documentation

## 2020-03-15 DIAGNOSIS — R45851 Suicidal ideations: Secondary | ICD-10-CM

## 2020-03-15 DIAGNOSIS — Z5900 Homelessness unspecified: Secondary | ICD-10-CM

## 2020-03-15 DIAGNOSIS — Z20822 Contact with and (suspected) exposure to covid-19: Secondary | ICD-10-CM | POA: Diagnosis not present

## 2020-03-15 DIAGNOSIS — E1165 Type 2 diabetes mellitus with hyperglycemia: Secondary | ICD-10-CM | POA: Diagnosis not present

## 2020-03-15 DIAGNOSIS — Z7984 Long term (current) use of oral hypoglycemic drugs: Secondary | ICD-10-CM | POA: Diagnosis not present

## 2020-03-15 DIAGNOSIS — R739 Hyperglycemia, unspecified: Secondary | ICD-10-CM

## 2020-03-15 NOTE — ED Triage Notes (Signed)
Patient brought in by EMS, patient is homeless, found at a [facility]. Patient is requesting inpatient psych placement. CBG is 399.

## 2020-03-16 ENCOUNTER — Other Ambulatory Visit: Payer: Self-pay

## 2020-03-16 ENCOUNTER — Encounter (HOSPITAL_COMMUNITY): Payer: Self-pay

## 2020-03-16 ENCOUNTER — Ambulatory Visit (HOSPITAL_COMMUNITY)
Admission: EM | Admit: 2020-03-16 | Discharge: 2020-03-17 | Disposition: A | Discharge status: Home / Self Care | Payer: Medicare Other | Attending: Registered Nurse | Admitting: Registered Nurse

## 2020-03-16 DIAGNOSIS — E119 Type 2 diabetes mellitus without complications: Secondary | ICD-10-CM | POA: Diagnosis not present

## 2020-03-16 DIAGNOSIS — Z652 Problems related to release from prison: Secondary | ICD-10-CM | POA: Diagnosis not present

## 2020-03-16 DIAGNOSIS — R45851 Suicidal ideations: Secondary | ICD-10-CM | POA: Diagnosis not present

## 2020-03-16 DIAGNOSIS — Z560 Unemployment, unspecified: Secondary | ICD-10-CM | POA: Insufficient documentation

## 2020-03-16 DIAGNOSIS — F209 Schizophrenia, unspecified: Secondary | ICD-10-CM

## 2020-03-16 DIAGNOSIS — R440 Auditory hallucinations: Secondary | ICD-10-CM | POA: Insufficient documentation

## 2020-03-16 DIAGNOSIS — Z7984 Long term (current) use of oral hypoglycemic drugs: Secondary | ICD-10-CM | POA: Diagnosis not present

## 2020-03-16 DIAGNOSIS — Z590 Homelessness: Secondary | ICD-10-CM | POA: Diagnosis not present

## 2020-03-16 DIAGNOSIS — Z79899 Other long term (current) drug therapy: Secondary | ICD-10-CM | POA: Diagnosis not present

## 2020-03-16 DIAGNOSIS — F4325 Adjustment disorder with mixed disturbance of emotions and conduct: Secondary | ICD-10-CM

## 2020-03-16 DIAGNOSIS — R441 Visual hallucinations: Secondary | ICD-10-CM | POA: Diagnosis not present

## 2020-03-16 HISTORY — DX: Type 2 diabetes mellitus without complications: E11.9

## 2020-03-16 LAB — COMPREHENSIVE METABOLIC PANEL
ALT: 11 U/L (ref 0–44)
AST: 13 U/L — ABNORMAL LOW (ref 15–41)
Albumin: 3.7 g/dL (ref 3.5–5.0)
Alkaline Phosphatase: 59 U/L (ref 38–126)
Anion gap: 10 (ref 5–15)
BUN: 7 mg/dL (ref 6–20)
CO2: 25 mmol/L (ref 22–32)
Calcium: 9 mg/dL (ref 8.9–10.3)
Chloride: 100 mmol/L (ref 98–111)
Creatinine, Ser: 0.55 mg/dL (ref 0.44–1.00)
GFR calc Af Amer: >60 mL/min (ref >60)
GFR calc non Af Amer: >60 mL/min (ref >60)
Glucose, Bld: 281 mg/dL — ABNORMAL HIGH (ref 70–99)
Potassium: 3.9 mmol/L (ref 3.5–5.1)
Sodium: 135 mmol/L (ref 135–145)
Total Bilirubin: 0.2 mg/dL — ABNORMAL LOW (ref 0.3–1.2)
Total Protein: 7.4 g/dL (ref 6.5–8.1)

## 2020-03-16 LAB — URINALYSIS, ROUTINE W REFLEX MICROSCOPIC
Bilirubin Urine: NEGATIVE
Glucose, UA: >=500 mg/dL — AB
Hgb urine dipstick: NEGATIVE
Ketones, ur: NEGATIVE mg/dL
Nitrite: NEGATIVE
Protein, ur: NEGATIVE mg/dL
Specific Gravity, Urine: 1.028 (ref 1.005–1.030)
pH: 6 (ref 5.0–8.0)

## 2020-03-16 LAB — CBC
HCT: 39.4 % (ref 36.0–46.0)
Hemoglobin: 12.4 g/dL (ref 12.0–15.0)
MCH: 26.4 pg (ref 26.0–34.0)
MCHC: 31.5 g/dL (ref 30.0–36.0)
MCV: 84 fL (ref 80.0–100.0)
Platelets: 427 10*3/uL — ABNORMAL HIGH (ref 150–400)
RBC: 4.69 MIL/uL (ref 3.87–5.11)
RDW: 14.4 % (ref 11.5–15.5)
WBC: 8.5 10*3/uL (ref 4.0–10.5)
nRBC: 0 % (ref 0.0–0.2)

## 2020-03-16 LAB — ETHANOL: Alcohol, Ethyl (B): <10 mg/dL (ref <10)

## 2020-03-16 LAB — RAPID URINE DRUG SCREEN, HOSP PERFORMED
Amphetamines: NOT DETECTED
Barbiturates: NOT DETECTED
Benzodiazepines: NOT DETECTED
Cocaine: NOT DETECTED
Opiates: NOT DETECTED
Tetrahydrocannabinol: NOT DETECTED

## 2020-03-16 LAB — I-STAT BETA HCG BLOOD, ED (MC, WL, AP ONLY): I-stat hCG, quantitative: <5 m[IU]/mL (ref <5)

## 2020-03-16 LAB — HEMOGLOBIN A1C
Hgb A1c MFr Bld: 10.1 % — ABNORMAL HIGH (ref 4.8–5.6)
Mean Plasma Glucose: 243.17 mg/dL

## 2020-03-16 LAB — GLUCOSE, CAPILLARY
Glucose-Capillary: 334 mg/dL — ABNORMAL HIGH (ref 70–99)
Glucose-Capillary: 232 mg/dL — ABNORMAL HIGH (ref 70–99)
Glucose-Capillary: 258 mg/dL — ABNORMAL HIGH (ref 70–99)

## 2020-03-16 LAB — SARS CORONAVIRUS 2 BY RT PCR (HOSPITAL ORDER, PERFORMED IN ~~LOC~~ HOSPITAL LAB): SARS Coronavirus 2: NEGATIVE

## 2020-03-16 MED ORDER — INSULIN ASPART 100 UNIT/ML ~~LOC~~ SOLN
0.0000 [IU] | Freq: Three times a day (TID) | SUBCUTANEOUS | Status: DC
  Administered 2020-03-16: 3 [IU] via SUBCUTANEOUS
  Administered 2020-03-17: 2 [IU] via SUBCUTANEOUS

## 2020-03-16 MED ORDER — CEPHALEXIN 500 MG PO CAPS: ORAL_CAPSULE | ORAL | 0 refills | Status: AC

## 2020-03-16 MED ORDER — CEPHALEXIN 500 MG PO CAPS
500.0000 mg | ORAL_CAPSULE | Freq: Once | ORAL | Status: AC
  Administered 2020-03-16: 500 mg via ORAL
  Filled 2020-03-16: qty 1

## 2020-03-16 MED ORDER — METFORMIN HCL 500 MG PO TABS
500.0000 mg | ORAL_TABLET | Freq: Two times a day (BID) | ORAL | Status: DC
  Administered 2020-03-16 – 2020-03-17 (×2): 500 mg via ORAL
  Filled 2020-03-16 (×2): qty 1
  Filled 2020-03-16 (×2): qty 14

## 2020-03-16 NOTE — ED Notes (Signed)
Patient in bathroom swearing to herself; Nurse checked on patient and patient reported that she is ok.

## 2020-03-16 NOTE — Progress Notes (Signed)
Dana Noble continues to rest in her chair bed at intervals and responding to internal stimuli via laughing excessively to her self. Immediately afterwards her mood changes to a concerned demeanor. Unable to obtain a blood draw for A1C.

## 2020-03-16 NOTE — ED Notes (Signed)
Nurse performed a skin assessment. Patient is responding to auditory hallucinations. Patient is cooperative. Patient had blood sugar check  Which was elevated at 334. Patient is utilizing toiletries.

## 2020-03-16 NOTE — ED Notes (Signed)
Still waiting to collect a urine culture due to the patient not being able to give urine and wanting to sleep.

## 2020-03-16 NOTE — ED Notes (Signed)
Patient is lying down and responding to auditory voices by laughing aloud.

## 2020-03-16 NOTE — ED Notes (Signed)
Pt given crayons and sheets to color to help keep her comfortable and busy. Extra blankets and eye cover were also given to comfort her when pt is ready to rest.

## 2020-03-16 NOTE — ED Provider Notes (Signed)
9:28 AM Chart review shows that patient has been medically cleared and psychiatry feels she is appropriate for the new [facility] facility.  Patient has been accepted in transfer to [facility] by Renaye Rakers, NP.  I will refill out the EMTALA and patient will be transferred.  I assessed the patient she has no complaints at this time and agrees with transfer.     Donne Baley, Canary Brim, MD 03/16/20 [phone]
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
## 2020-03-16 NOTE — ED Notes (Signed)
Attempted to wake up pt. For urine culture collection and administration of her Cephalexin , pt. Got aggravated and still refused to take med. Went back to sleep.

## 2020-03-16 NOTE — BH Assessment (Addendum)
Comprehensive Clinical Assessment (CCA) Screening, Triage and Referral Note  03/16/2020 Dana Noble [id number] Patient presents this date voluntary with S/I although denies any plan or intent. Patient denies any H/I or AVH. Patient is vague in reference to a plan. Patient states she is currently homeless and has been using cocaine (crack) excessively although will not elaborate on time frame or amounts used. Patient's UDS is negative this date and BAL is less than 10. Patient denies currently having a OP provider and when asked in reference to mental health symptoms she reports "I have it all." Again patient is not forthcoming with any information or history. Per chart review patient was last seen on 10/09/19 when she presented with similar symptoms. Patient per chart has a prior diagnosis of Schizophrenia. Patient denies this date any history of a prior diagnosis.      Per that assessment as of that date: Dana Noble is an 34 y.o. female who presents to the ED voluntarily. Pt reports she is homeless and needs somewhere to stay for the night because she is pregnant and has nowhere to go. Pt reports she wanted to stay in the ED "so I can get some peace of mind." Pt denies SI, HI, or AVH. Pt was seen in [facility] earlier this date for same and was d/c. Pt returned to the ED several hours later. Pt states she does not have any resources or support. Pt reports her family lives in [place], [place] but reports they are not supportive. Pt states the child's father is not involved in her pregnancy. Pt is vague and name has to be called multiple times in order for her to engage in the assessment. Patient did not meet inpatient criteria at that time.   Patient is oriented x 4 and speaks in a pressured voice. Patient's memory appears to be intact with thoughts organized. Patient does not appear to be responding to internal stimuli. Case was staffed with Rankin NP who recommended patient be transported to [facility] for  further observation.    Visit Diagnosis: Schizophrenia (per notes)    ICD-10-CM   1. Suicidal ideation  R45.851   2. Schizophrenia, unspecified type (HCC)  F20.9   3. Homeless  Z59.0   4. Hyperglycemia  R73.9   5. Urinary tract infection without hematuria, site unspecified  N39.0     Patient Reported Information How did you hear about [place]? Self   Referral name: No data recorded  Referral phone number: No data recorded Whom do you see for routine medical problems? I don't have a doctor   Practice/Facility Name: No data recorded  Practice/Facility Phone Number: No data recorded  Name of Contact: No data recorded  Contact Number: No data recorded  Contact Fax Number: No data recorded  Prescriber Name: No data recorded  Prescriber Address (if known): No data recorded What Is the Reason for Your Visit/Call Today? Ongoing S/I and SA issues  How Long Has This Been Causing You Problems? <Week  Have You Recently Been in Any Inpatient Treatment (Hospital/Detox/Crisis Center/28-Day Program)? No   Name/Location of Program/Hospital:No data recorded  How Long Were You There? No data recorded  When Were You Discharged? No data recorded Have You Ever Received Services From [facility] Before? Yes   Who Do You See at [facility]? Pt has been assessed before by TTS presenting with similar symptoms  Have You Recently Had Any Thoughts About Hurting Yourself? Yes   Are You Planning to Commit Suicide/Harm Yourself  At This time?  No  Have you Recently Had Thoughts About Hurting Someone Karolee Ohs? No   Explanation: No data recorded Have You Used Any Alcohol or Drugs in the Past 24 Hours? Yes   How Long Ago Did You Use Drugs or Alcohol?  0200   What Did You Use and How Much? Crack cocaine unknown amount  What Do You Feel Would Help You the Most Today? Other (Comment) (Help with SA issues)  Do You Currently Have a Therapist/Psychiatrist? No   Name of Therapist/Psychiatrist: No data  recorded  Have You Been Recently Discharged From Any Office Practice or Programs? No   Explanation of Discharge From Practice/Program:  No data recorded    CCA Screening Triage Referral Assessment Type of Contact: Face-to-Face   Is this Initial or Reassessment? No data recorded  Date Telepsych consult ordered in [facility]:  No data recorded  Time Telepsych consult ordered in [facility]:  No data recorded Patient Reported Information Reviewed? Yes   Patient Left Without Being Seen? No data recorded  Reason for Not Completing Assessment: Called to complete BH Assessment; no answer  Collateral Involvement: None at this time  Does Patient Have a Automotive engineer Guardian? No data recorded  Name and Contact of Legal Guardian:  self  If Minor and Not Living with Parent(s), Who has Custody? No data recorded Is CPS involved or ever been involved? Never  Is APS involved or ever been involved? Never  Patient Determined To Be At Risk for Harm To Self or Others Based on Review of Patient Reported Information or Presenting Complaint? No   Method: No data recorded  Availability of Means: No data recorded  Intent: No data recorded  Notification Required: No data recorded  Additional Information for Danger to Others Potential:  No data recorded  Additional Comments for Danger to Others Potential:  No data recorded  Are There Guns or Other Weapons in Your Home?  No data recorded   Types of Guns/Weapons: No data recorded   Are These Weapons Safely Secured?                              No data recorded   Who Could Verify You Are Able To Have These Secured:    No data recorded Do You Have any Outstanding Charges, Pending Court Dates, Parole/Probation? No data recorded Contacted To Inform of Risk of Harm To Self or Others: Other: Comment (NA)  [facility] of Assessment: [facility]  Does Patient Present under Involuntary Commitment? No   IVC Papers Initial File Date: No data recorded  [place] of Residence:  [place]  Patient Currently Receiving the Following Services: Not Receiving Services (None at this time)   Determination of Need: No data recorded  Options For Referral: Outpatient Therapy  Case was staffed with Rankin NP who recommended patient be transported to [facility] for further observation.     Alfredia Ferguson, LCAS

## 2020-03-16 NOTE — ED Notes (Signed)
Pt laughing and talking to herself, responding to internal stimuli.  Resting at present. No distress noted.

## 2020-03-16 NOTE — ED Notes (Signed)
Pt. Reminded to collect urine sample for urine culture, refused to go to bathroom at this time. Pt. Got upset for bothering her sleep.  Will collect soon as pt. Is able to urinate.

## 2020-03-16 NOTE — ED Notes (Addendum)
Received Dana Noble from [facility], the [facility]. She is alert and oriented x4 , but stated she feels very tired. He denied auditory and visual hallucinations at the present time. She stated feeling suicidal at  intervals but does not want to die. She is hoping to receive help with getting off of drugs, finding a place to live and a job. She is jittery at intervals. Her skin assessment was completed with Jacqui, MHT.

## 2020-03-16 NOTE — ED Provider Notes (Signed)
Behavioral Health Admission H&P [facility] & OBS)  Date: 03/16/20 Patient Name: Dana Noble MRN: [id number] Chief Complaint:  Chief Complaint  Patient presents with   Hallucinations      Diagnoses:  Final diagnoses:  None    HPI: Dana Noble, 34 y.o., female patient presents to [facility] for direct admit to continuous assessment from [facility] ED with complaints of auditory/visula hallucinations and seeking assistance with outpatient psychiatric services.  Patient seen face to face by this provider, consulted with consulted with Dr. Lucianne Muss; and chart reviewed on 03/16/20.  On evaluation Dana Noble reports she wants to get her life together "get off the streets and get some help.  I don't want to do drugs no more and live on the street or the shelter.  I need help; somebody to tell me what to do and how to go about it.  I need some guidance."  Patient had 16 ED visits in the last 6 months.  Patient is homeless and has no support system.   During evaluation Dana Noble is alert/oriented x 4; calm/cooperative; and mood anxious, and labile.  Her speech is pressured but clear.  She does not appear to be responding to internal/external stimuli or delusional thoughts.  Patient denies suicidal/self-harm/homicidal ideation, psychosis, and paranoia.  Patient asking for a shower so she can get cleaned up and "Then I just want to rest so I can get my mind together.  Patient answered question appropriately.  Patient was unable to answer questions related to the last she has taken her medications or what medication she is suppose to be taking.  States that she does not currently have any outpatient psychiatric services.  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  No psychotropic medications noted in chart review.        PHQ 2-9:     ED from 03/15/2020 in [facility] from 10[facility]09/2019 in [facility] 10[facility]08[facility]  C-SSRS RISK CATEGORY High Risk Error: Q7 should not be populated when Q6 is No High Risk       Total Time spent with patient: 30 minutes  Musculoskeletal  Strength & Muscle Tone: within normal limits Gait & Station: normal Patient leans: N/A  Psychiatric Specialty Exam  Presentation General Appearance: Appropriate for Environment;Casual;Disheveled  Eye Contact:Fair  Speech:Pressured  Speech Volume:Normal  Handedness:Right   Mood and Affect  Mood:Anxious;Depressed;Labile  Affect:Congruent   Thought Process  Thought Processes:Coherent;Goal Directed  Descriptions of Associations:Circumstantial  Orientation:Full (Time, Place and Person)  Thought Content:WDL  Hallucinations:Hallucinations: None  Ideas of Reference:None  Suicidal Thoughts:Suicidal Thoughts: No  Homicidal Thoughts:Homicidal Thoughts: No   Sensorium  Memory:Immediate Good;Recent Good;Remote Fair  Judgment:Intact  Insight:Present   Executive Functions  Concentration:Fair  Attention Span:Fair  Recall:Fair  Fund of Knowledge:Fair  Language:Good   Psychomotor Activity  Psychomotor Activity:Psychomotor Activity: Normal   Assets  Assets:Communication Skills;Desire for Improvement   Sleep  Sleep:Sleep: Good   Physical Exam Vitals and nursing note reviewed. Exam conducted with a chaperone present.  Constitutional:      Appearance: Normal appearance.  Pulmonary:     Effort: Pulmonary effort is normal.  Musculoskeletal:        General: Normal range of motion.     Cervical back: Normal range of motion.  Skin:    General: Skin is warm and dry.  Neurological:  Mental Status: She is alert.  Psychiatric:        Attention and Perception: Attention and perception normal.        Mood and Affect: Mood normal. Affect is labile.        Speech: Speech is rapid and pressured.         Behavior: Behavior normal.        Thought Content: Thought content normal. Thought content is not paranoid or delusional. Thought content does not include homicidal or suicidal ideation.        Cognition and Memory: Cognition and memory normal.        Judgment: Judgment is impulsive.    Review of Systems  Psychiatric/Behavioral: Positive for substance abuse (Cocaine). Depression: Stable. Hallucinations: Denies. Memory loss: Denies. Suicidal ideas: Denies. The patient does not have insomnia. Nervous/anxious: Stable.   All other systems reviewed and are negative.   Blood pressure 110/80, pulse 92, temperature 98.9 F (37.2 C), temperature source Oral, resp. rate 18, height 5\' 9"  (1.753 m), weight (!) 97.5 kg, SpO2 100 %. Body mass index is 31.75 kg/m.  Past Psychiatric History: Cocaine abuse/dependence, cocaine induce mood disorder   Is the patient at risk to self? No  Has the patient been a risk to self in the past 6 months? No .    Has the patient been a risk to self within the distant past? No   Is the patient a risk to others? No   Has the patient been a risk to others in the past 6 months? No   Has the patient been a risk to others within the distant past? No   Past Medical History:  Past Medical History:  Diagnosis Date   Diabetes mellitus without complication (HCC)    Diabetes mellitus, type II (HCC)    Substance abuse (HCC)     Past Surgical History:  Procedure Laterality Date   KNEE SURGERY     WRIST SURGERY      Family History:  Family History  Family history unknown: Yes    Social History:  Social History   Socioeconomic History   Marital status: Single    Spouse name: Not on file   Number of children: Not on file   Years of education: Not on file   Highest education level: Not on file  Occupational History   Occupation: unemployed  Tobacco Use   Smoking status: Never Smoker   Smokeless tobacco: Never Used  Use: Never  used  Substance and Sexual Activity   Alcohol use: Not Currently   Drug use: Not Currently   Sexual activity: Not Currently  Other Topics Concern   Not on file  Social History Narrative   Not on file   Social Determinants of Health   Financial Resource Strain:    Difficulty of Paying Living Expenses:   Food Insecurity:    Worried About Building services engineer in the Last Year:    Programme researcher, broadcasting/film/video in the Last Year:   Transportation Needs:    Barista (Medical):    Lack of Transportation (Non-Medical):   Physical Activity:    Days of Exercise per Week:    Minutes of Exercise per Session:   Stress:    Feeling of Stress :   Social Connections:    Frequency of Communication with Friends and Family:    Frequency of Social Gatherings with Friends and Family:    Attends Religious Services:  Active Member of Clubs or Organizations:    Attends Engineer, structural:    Marital Status:   Intimate Partner Violence:    Fear of Current or Ex-Partner:    Emotionally Abused:    Physically Abused:    Sexually Abused:     SDOH:  SDOH Screenings   Alcohol Screen:    Last Alcohol Screening Score (AUDIT):   Depression (PHQ2-9):    PHQ-2 Score:   Physicist, medical Strain:    Difficulty of Paying Living Expenses:   Food Insecurity:    Worried About Programme researcher, broadcasting/film/video in the Last Year:    Barista in the Last Year:   Housing:    Last Housing Risk Score:   Physical Activity:    Days of Exercise per Week:    Minutes of Exercise per Session:   Social Connections:    Frequency of Communication with Friends and Family:    Frequency of Social Gatherings with Friends and Family:    Attends Religious Services:    Active Member of Clubs or Organizations:    Attends Engineer, structural:    Marital Status:   Stress:    Feeling of Stress :   Tobacco Use: Low Risk    Smoking Tobacco Use: Never Smoker    Smokeless Tobacco Use: Never Used  Transportation Needs:    Freight forwarder (Medical):    Lack of Transportation (Non-Medical):     Last Labs:  Admission on 03/15/2020, Discharged on 03/16/2020  Component Date Value Ref Range Status   Sodium 03/16/2020 135  135 - 145 mmol/L Final   Potassium 03/16/2020 3.9  3.5 - 5.1 mmol/L Final   Chloride 03/16/2020 100  98 - 111 mmol/L Final   CO2 03/16/2020 25  22 - 32 mmol/L Final   Glucose, Bld 03/16/2020 281* 70 - 99 mg/dL Final   Glucose reference range applies only to samples taken after fasting for at least 8 hours.   BUN 03/16/2020 7  6 - 20 mg/dL Final   Creatinine, Ser 03/16/2020 0.55  0.44 - 1.00 mg/dL Final   Calcium 16/05/9603 9.0  8.9 - 10.3 mg/dL Final   Total Protein 54/04/8118 7.4  6.5 - 8.1 g/dL Final   Albumin 14/78/2956 3.7  3.5 - 5.0 g/dL Final   AST 21/30/8657 13* 15 - 41 U/L Final   ALT 03/16/2020 11  0 - 44 U/L Final   Alkaline Phosphatase 03/16/2020 59  38 - 126 U/L Final   Total Bilirubin 03/16/2020 0.2* 0.3 - 1.2 mg/dL Final   GFR calc non Af Amer 03/16/2020 >60  >60 mL/min Final   GFR calc Af Amer 03/16/2020 >60  >60 mL/min Final   Anion gap 03/16/2020 10  5 - 15 Final   Performed at [facility], [address], [place], [place] [address]   Alcohol, Ethyl (B) 03/16/2020 <10  <10 mg/dL Final   Comment: (NOTE) Lowest detectable limit for serum alcohol is 10 mg/dL.  For medical purposes only. Performed at [facility], [address], [place], [place] [address]    WBC 03/16/2020 8.5  4.0 - 10.5 K/uL Final   RBC 03/16/2020 4.69  3.87 - 5.11 MIL/uL Final   Hemoglobin 03/16/2020 12.4  12.0 - 15.0 g/dL Final   HCT 41/32/4401 39.4  36 - 46 % Final   MCV 03/16/2020 84.0  80.0 - 100.0 fL Final   MCH 03/16/2020 26.4  26.0 - 34.0  pg Final   MCHC 03/16/2020 31.5  30.0 - 36.0 g/dL Final   RDW 16/05/9603 14.4  11.5 - 15.5 % Final   Platelets  03/16/2020 427* 150 - 400 K/uL Final   nRBC 03/16/2020 0.0  0.0 - 0.2 % Final   Performed at [facility], [address] [place], [place] [address]   Opiates 03/16/2020 NONE DETECTED  NONE DETECTED Final   Cocaine 03/16/2020 NONE DETECTED  NONE DETECTED Final   Benzodiazepines 03/16/2020 NONE DETECTED  NONE DETECTED Final   Amphetamines 03/16/2020 NONE DETECTED  NONE DETECTED Final   Tetrahydrocannabinol 03/16/2020 NONE DETECTED  NONE DETECTED Final   Barbiturates 03/16/2020 NONE DETECTED  NONE DETECTED Final   Comment: (NOTE) DRUG SCREEN FOR MEDICAL PURPOSES ONLY.  IF CONFIRMATION IS NEEDED FOR ANY PURPOSE, NOTIFY LAB WITHIN 5 DAYS.  LOWEST DETECTABLE LIMITS FOR URINE DRUG SCREEN Drug Class                     Cutoff (ng/mL) Amphetamine and metabolites    1000 Barbiturate and metabolites    200 Benzodiazepine                 200 Tricyclics and metabolites     300 Opiates and metabolites        300 Cocaine and metabolites        300 THC                            50 Performed at [facility], [address], [place], [place] [address]    I-stat hCG, quantitative 03/16/2020 <5.0  <5 mIU/mL Final   Comment 3 03/16/2020          Final   Comment:   GEST. AGE      CONC.  (mIU/mL)   <=1 WEEK        5 - 50     2 WEEKS       50 - 500     3 WEEKS       100 - 10,000     4 WEEKS     1,000 - 30,000        FEMALE AND NON-PREGNANT FEMALE:     LESS THAN 5 mIU/mL    Color, Urine 03/16/2020 YELLOW  YELLOW Final   APPearance 03/16/2020 CLEAR  CLEAR Final   Specific Gravity, Urine 03/16/2020 1.028  1.005 - 1.030 Final   pH 03/16/2020 6.0  5.0 - 8.0 Final   Glucose, UA 03/16/2020 >=500* NEGATIVE mg/dL Final   Hgb urine dipstick 03/16/2020 NEGATIVE  NEGATIVE Final   Bilirubin Urine 03/16/2020 NEGATIVE  NEGATIVE Final   Ketones, ur 03/16/2020 NEGATIVE  NEGATIVE mg/dL Final   Protein, ur 78/29/5621 NEGATIVE  NEGATIVE mg/dL Final    Nitrite 30/86/5784 NEGATIVE  NEGATIVE Final   Leukocytes,Ua 03/16/2020 MODERATE* NEGATIVE Final   RBC / HPF 03/16/2020 6-10  0 - 5 RBC/hpf Final   WBC, UA 03/16/2020 21-50  0 - 5 WBC/hpf Final   Bacteria, UA 03/16/2020 RARE* NONE SEEN Final   Squamous Epithelial / LPF 03/16/2020 0-5  0 - 5 Final   Performed at [facility], [address] [place], [place] [address]   SARS Coronavirus 2 03/16/2020 NEGATIVE  NEGATIVE Final   Comment: (NOTE) SARS-CoV-2 target nucleic acids are NOT DETECTED.  The SARS-CoV-2 RNA is generally detectable in upper and lower respiratory specimens during the  acute phase of infection. The lowest concentration of SARS-CoV-2 viral copies this assay can detect is 250 copies / mL. A negative result does not preclude SARS-CoV-2 infection and should not be used as the sole basis for treatment or other patient management decisions.  A negative result may occur with improper specimen collection / handling, submission of specimen other than nasopharyngeal swab, presence of viral mutation(s) within the areas targeted by this assay, and inadequate number of viral copies (<250 copies / mL). A negative result must be combined with clinical observations, patient history, and epidemiological information.  Fact Sheet for Patients:   BoilerBrush.com.cy  Fact Sheet for Healthcare Providers: https://pope.com/  This test is not yet approved or                           cleared by the [place] FDA and has been authorized for detection and/or diagnosis of SARS-CoV-2 by FDA under an Emergency Use Authorization (EUA).  This EUA will remain in effect (meaning this test can be used) for the duration of the COVID-19 declaration under Section 564(b)(1) of the Act, 21 U.S.C. section 360bbb-3(b)(1), unless the authorization is terminated or revoked sooner.  Performed at [facility], [address] [place], [place] [address]   Admission on 01/30/2020, Discharged on 01/30/2020  Component Date Value Ref Range Status   Glucose-Capillary 01/30/2020 406* 70 - 99 mg/dL Final   Glucose reference range applies only to samples taken after fasting for at least 8 hours.   Comment 1 01/30/2020 Notify RN   Final  Admission on 01/29/2020, Discharged on 01/29/2020  Component Date Value Ref Range Status   WBC 01/29/2020 8.6  4.0 - 10.5 K/uL Final   RBC 01/29/2020 4.38  3.87 - 5.11 MIL/uL Final   Hemoglobin 01/29/2020 11.7* 12.0 - 15.0 g/dL Final   HCT 60/45/4098 37.4  36 - 46 % Final   MCV 01/29/2020 85.4  80.0 - 100.0 fL Final   MCH 01/29/2020 26.7  26.0 - 34.0 pg Final   MCHC 01/29/2020 31.3  30.0 - 36.0 g/dL Final   RDW 11/91/4782 13.7  11.5 - 15.5 % Final   Platelets 01/29/2020 573* 150 - 400 K/uL Final   nRBC 01/29/2020 0.0  0.0 - 0.2 % Final   Neutrophils Relative % 01/29/2020 57  % Final   Neutro Abs 01/29/2020 4.9  1.7 - 7.7 K/uL Final   Lymphocytes Relative 01/29/2020 36  % Final   Lymphs Abs 01/29/2020 3.1  0.7 - 4.0 K/uL Final   Monocytes Relative 01/29/2020 6  % Final   Monocytes Absolute 01/29/2020 0.5  0 - 1 K/uL Final   Eosinophils Relative 01/29/2020 1  % Final   Eosinophils Absolute 01/29/2020 0.1  0 - 0 K/uL Final   Basophils Relative 01/29/2020 0  % Final   Basophils Absolute 01/29/2020 0.0  0 - 0 K/uL Final   Immature Granulocytes 01/29/2020 0  % Final   Abs Immature Granulocytes 01/29/2020 0.03  0.00 - 0.07 K/uL Final   Performed at [facility], [address] [place], [place] [address]   Sodium 01/29/2020 136  135 - 145 mmol/L Final   Potassium 01/29/2020 3.5  3.5 - 5.1 mmol/L Final   Chloride 01/29/2020 100  98 - 111 mmol/L Final   CO2 01/29/2020 25  22 - 32 mmol/L Final   Glucose, Bld 01/29/2020 352* 70 - 99 mg/dL Final   Glucose reference  range applies only to samples taken after fasting for at least 8 hours.   BUN  01/29/2020 6  6 - 20 mg/dL Final   Creatinine, Ser 01/29/2020 0.68  0.44 - 1.00 mg/dL Final   Calcium 16/05/9603 8.7* 8.9 - 10.3 mg/dL Final   Total Protein 54/04/8118 7.3  6.5 - 8.1 g/dL Final   Albumin 14/78/2956 2.9* 3.5 - 5.0 g/dL Final   AST 21/30/8657 15  15 - 41 U/L Final   ALT 01/29/2020 13  0 - 44 U/L Final   Alkaline Phosphatase 01/29/2020 75  38 - 126 U/L Final   Total Bilirubin 01/29/2020 0.6  0.3 - 1.2 mg/dL Final   GFR calc non Af Amer 01/29/2020 >60  >60 mL/min Final   GFR calc Af Amer 01/29/2020 >60  >60 mL/min Final   Anion gap 01/29/2020 11  5 - 15 Final   Performed at [facility], [address] [place], [place] [address]   Glucose-Capillary 01/29/2020 338* 70 - 99 mg/dL Final   Glucose reference range applies only to samples taken after fasting for at least 8 hours.  Admission on 01/26/2020, Discharged on 01/26/2020  Component Date Value Ref Range Status   Glucose-Capillary 01/26/2020 451* 70 - 99 mg/dL Final   Glucose reference range applies only to samples taken after fasting for at least 8 hours.   WBC 01/26/2020 7.8  4.0 - 10.5 K/uL Final   RBC 01/26/2020 4.62  3.87 - 5.11 MIL/uL Final   Hemoglobin 01/26/2020 12.3  12.0 - 15.0 g/dL Final   HCT 29/52/8413 39.0  36 - 46 % Final   MCV 01/26/2020 84.4  80.0 - 100.0 fL Final   MCH 01/26/2020 26.6  26.0 - 34.0 pg Final   MCHC 01/26/2020 31.5  30.0 - 36.0 g/dL Final   RDW 24/40/1027 13.8  11.5 - 15.5 % Final   Platelets 01/26/2020 626* 150 - 400 K/uL Final   nRBC 01/26/2020 0.0  0.0 - 0.2 % Final   Neutrophils Relative % 01/26/2020 53  % Final   Neutro Abs 01/26/2020 4.1  1.7 - 7.7 K/uL Final   Lymphocytes Relative 01/26/2020 41  % Final   Lymphs Abs 01/26/2020 3.2  0.7 - 4.0 K/uL Final   Monocytes Relative 01/26/2020 4  % Final   Monocytes Absolute 01/26/2020 0.3  0 - 1 K/uL Final   Eosinophils Relative 01/26/2020 2  % Final   Eosinophils Absolute 01/26/2020 0.1  0 - 0 K/uL  Final   Basophils Relative 01/26/2020 0  % Final   Basophils Absolute 01/26/2020 0.0  0 - 0 K/uL Final   Immature Granulocytes 01/26/2020 0  % Final   Abs Immature Granulocytes 01/26/2020 0.02  0.00 - 0.07 K/uL Final   Performed at [facility], [address] [place], [place] [address]   Sodium 01/26/2020 133* 135 - 145 mmol/L Final   Potassium 01/26/2020 3.8  3.5 - 5.1 mmol/L Final   Chloride 01/26/2020 99  98 - 111 mmol/L Final   CO2 01/26/2020 26  22 - 32 mmol/L Final   Glucose, Bld 01/26/2020 331* 70 - 99 mg/dL Final   Glucose reference range applies only to samples taken after fasting for at least 8 hours.   BUN 01/26/2020 8  6 - 20 mg/dL Final   Creatinine, Ser 01/26/2020 0.64  0.44 - 1.00 mg/dL Final   Calcium 44/10/4740 8.6* 8.9 - 10.3 mg/dL Final   Total Protein 59/56/3875 7.6  6.5 -  8.1 g/dL Final   Albumin 82/95/6213 2.9* 3.5 - 5.0 g/dL Final   AST 08/65/7846 11* 15 - 41 U/L Final   ALT 01/26/2020 11  0 - 44 U/L Final   Alkaline Phosphatase 01/26/2020 78  38 - 126 U/L Final   Total Bilirubin 01/26/2020 0.1* 0.3 - 1.2 mg/dL Final   GFR calc non Af Amer 01/26/2020 >60  >60 mL/min Final   GFR calc Af Amer 01/26/2020 >60  >60 mL/min Final   Anion gap 01/26/2020 8  5 - 15 Final   Performed at [facility], [address] [place], [place] [address]  Admission on 01/25/2020, Discharged on 01/26/2020  Component Date Value Ref Range Status   Sodium 01/25/2020 136  135 - 145 mmol/L Final   Potassium 01/25/2020 4.4  3.5 - 5.1 mmol/L Final   Chloride 01/25/2020 97* 98 - 111 mmol/L Final   CO2 01/25/2020 26  22 - 32 mmol/L Final   Glucose, Bld 01/25/2020 377* 70 - 99 mg/dL Final   Glucose reference range applies only to samples taken after fasting for at least 8 hours.   BUN 01/25/2020 6  6 - 20 mg/dL Final   Creatinine, Ser 01/25/2020 0.66  0.44 - 1.00 mg/dL Final   Calcium 28/41/3244 8.7* 8.9 - 10.3 mg/dL Final   GFR calc non  Af Amer 01/25/2020 >60  >60 mL/min Final   GFR calc Af Amer 01/25/2020 >60  >60 mL/min Final   Anion gap 01/25/2020 13  5 - 15 Final   Performed at [facility], [address], [place], [place] [address]   WBC 01/25/2020 7.6  4.0 - 10.5 K/uL Final   RBC 01/25/2020 4.28  3.87 - 5.11 MIL/uL Final   Hemoglobin 01/25/2020 11.4* 12.0 - 15.0 g/dL Final   HCT 25/36/6440 36.1  36 - 46 % Final   MCV 01/25/2020 84.3  80.0 - 100.0 fL Final   MCH 01/25/2020 26.6  26.0 - 34.0 pg Final   MCHC 01/25/2020 31.6  30.0 - 36.0 g/dL Final   RDW 34/74/2595 13.6  11.5 - 15.5 % Final   Platelets 01/25/2020 627* 150 - 400 K/uL Final   nRBC 01/25/2020 0.0  0.0 - 0.2 % Final   Performed at [facility], [address], [place], [place] [address]   Glucose-Capillary 01/25/2020 375* 70 - 99 mg/dL Final   Glucose reference range applies only to samples taken after fasting for at least 8 hours.   I-stat hCG, quantitative 01/25/2020 <5.0  <5 mIU/mL Final   Comment 3 01/25/2020          Final   Comment:   GEST. AGE      CONC.  (mIU/mL)   <=1 WEEK        5 - 50     2 WEEKS       50 - 500     3 WEEKS       100 - 10,000     4 WEEKS     1,000 - 30,000        FEMALE AND NON-PREGNANT FEMALE:     LESS THAN 5 mIU/mL    Glucose-Capillary 01/25/2020 292* 70 - 99 mg/dL Final   Glucose reference range applies only to samples taken after fasting for at least 8 hours.  Admission on 01/24/2020, Discharged on 01/24/2020  Component Date Value Ref Range Status   Glucose-Capillary 01/24/2020 323* 70 - 99 mg/dL Final  Glucose reference range applies only to samples taken after fasting for at least 8 hours.  Admission on 01/20/2020, Discharged on 01/20/2020  Component Date Value Ref Range Status   Sodium 01/20/2020 134* 135 - 145 mmol/L Final   Potassium 01/20/2020 4.1  3.5 - 5.1 mmol/L Final   Chloride 01/20/2020 99  98 - 111 mmol/L Final   CO2 01/20/2020 25  22 - 32 mmol/L Final   Glucose, Bld  01/20/2020 469* 70 - 99 mg/dL Final   Glucose reference range applies only to samples taken after fasting for at least 8 hours.   BUN 01/20/2020 5* 6 - 20 mg/dL Final   Creatinine, Ser 01/20/2020 0.59  0.44 - 1.00 mg/dL Final   Calcium 19/37/9024 8.2* 8.9 - 10.3 mg/dL Final   GFR calc non Af Amer 01/20/2020 >60  >60 mL/min Final   GFR calc Af Amer 01/20/2020 >60  >60 mL/min Final   Anion gap 01/20/2020 10  5 - 15 Final   Performed at [facility], [address] [place], [place] [address]   Color, Urine 01/20/2020 YELLOW  YELLOW Final   APPearance 01/20/2020 HAZY* CLEAR Final   Specific Gravity, Urine 01/20/2020 1.021  1.005 - 1.030 Final   pH 01/20/2020 6.0  5.0 - 8.0 Final   Glucose, UA 01/20/2020 >=500* NEGATIVE mg/dL Final   Hgb urine dipstick 01/20/2020 SMALL* NEGATIVE Final   Bilirubin Urine 01/20/2020 NEGATIVE  NEGATIVE Final   Ketones, ur 01/20/2020 NEGATIVE  NEGATIVE mg/dL Final   Protein, ur 32/99/2426 NEGATIVE  NEGATIVE mg/dL Final   Nitrite 83/41/9622 NEGATIVE  NEGATIVE Final   Leukocytes,Ua 01/20/2020 LARGE* NEGATIVE Final   RBC / HPF 01/20/2020 6-10  0 - 5 RBC/hpf Final   WBC, UA 01/20/2020 >50* 0 - 5 WBC/hpf Final   Bacteria, UA 01/20/2020 MANY* NONE SEEN Final   Squamous Epithelial / LPF 01/20/2020 0-5  0 - 5 Final   WBC Clumps 01/20/2020 PRESENT   Final   Performed at [facility], [address] [place], [place] [address]   Glucose-Capillary 01/20/2020 458* 70 - 99 mg/dL Final   Glucose reference range applies only to samples taken after fasting for at least 8 hours.  Admission on 01/17/2020, Discharged on 01/18/2020  Component Date Value Ref Range Status   Glucose-Capillary 01/18/2020 482* 70 - 99 mg/dL Final   Glucose reference range applies only to samples taken after fasting for at least 8 hours.   WBC 01/18/2020 11.7* 4.0 - 10.5 K/uL Final   RBC 01/18/2020 4.04  3.87 - 5.11 MIL/uL Final    Hemoglobin 01/18/2020 11.1* 12.0 - 15.0 g/dL Final   HCT 92/06/9416 33.2* 36 - 46 % Final   MCV 01/18/2020 82.2  80.0 - 100.0 fL Final   MCH 01/18/2020 27.5  26.0 - 34.0 pg Final   MCHC 01/18/2020 33.4  30.0 - 36.0 g/dL Final   RDW 40/81/4481 13.8  11.5 - 15.5 % Final   Platelets 01/18/2020 370  150 - 400 K/uL Final   nRBC 01/18/2020 0.0  0.0 - 0.2 % Final   Performed at [facility], [address] [place], [place] [address]   Sodium 01/18/2020 132* 135 - 145 mmol/L Final   Potassium 01/18/2020 3.8  3.5 - 5.1 mmol/L Final   Chloride 01/18/2020 94* 98 - 111 mmol/L Final   CO2 01/18/2020 28  22 - 32 mmol/L Final   Glucose, Bld 01/18/2020 440* 70 - 99 mg/dL Final  Glucose reference range applies only to samples taken after fasting for at least 8 hours.   BUN 01/18/2020 6  6 - 20 mg/dL Final   Creatinine, Ser 01/18/2020 0.74  0.44 - 1.00 mg/dL Final   Calcium 16/10/960406/08/2019 8.7* 8.9 - 10.3 mg/dL Final   GFR calc non Af Amer 01/18/2020 >60  >60 mL/min Final   GFR calc Af Amer 01/18/2020 >60  >60 mL/min Final   Anion gap 01/18/2020 10  5 - 15 Final   Performed at [facility], [address] [place], [place] [address]   I-stat hCG, quantitative 01/18/2020 <5.0  <5 mIU/mL Final   Comment 3 01/18/2020          Final   Comment:   GEST. AGE      CONC.  (mIU/mL)   <=1 WEEK        5 - 50     2 WEEKS       50 - 500     3 WEEKS       100 - 10,000     4 WEEKS     1,000 - 30,000        FEMALE AND NON-PREGNANT FEMALE:     LESS THAN 5 mIU/mL   Admission on 01/16/2020, Discharged on 01/16/2020  Component Date Value Ref Range Status   Glucose-Capillary 01/15/2020 553* 70 - 99 mg/dL Final   Glucose reference range applies only to samples taken after fasting for at least 8 hours.   Comment 1 01/15/2020 Notify RN   Final   Lactic Acid, Venous 01/16/2020 1.2  0.5 - 1.9 mmol/L Final   Performed at [facility], [address] [place], [place] [address]   WBC 01/16/2020 10.2  4.0 - 10.5 K/uL Final   RBC 01/16/2020 4.35  3.87 - 5.11 MIL/uL Final   Hemoglobin 01/16/2020 12.0  12.0 - 15.0 g/dL Final   HCT 78/29/562105/30/2021 36.2  36 - 46 % Final   MCV 01/16/2020 83.2  80.0 - 100.0 fL Final   MCH 01/16/2020 27.6  26.0 - 34.0 pg Final   MCHC 01/16/2020 33.1  30.0 - 36.0 g/dL Final   RDW 30/86/578405/30/2021 13.7  11.5 - 15.5 % Final   Platelets 01/16/2020 285  150 - 400 K/uL Final   nRBC 01/16/2020 0.0  0.0 - 0.2 % Final   Neutrophils Relative % 01/16/2020 75  % Final   Neutro Abs 01/16/2020 7.7  1.7 - 7.7 K/uL Final   Lymphocytes Relative 01/16/2020 10  % Final   Lymphs Abs 01/16/2020 1.0  0.7 - 4.0 K/uL Final   Monocytes Relative 01/16/2020 14  % Final   Monocytes Absolute 01/16/2020 1.4* 0 - 1 K/uL Final   Eosinophils Relative 01/16/2020 0  % Final   Eosinophils Absolute 01/16/2020 0.0  0 - 0 K/uL Final   Basophils Relative 01/16/2020 0  % Final   Basophils Absolute 01/16/2020 0.0  0 - 0 K/uL Final   Immature Granulocytes 01/16/2020 1  % Final   Abs Immature Granulocytes 01/16/2020 0.06  0.00 - 0.07 K/uL Final   Performed at [facility], [address] [place], [place] [address]   Color, Urine 01/16/2020 YELLOW  YELLOW Final   APPearance 01/16/2020 HAZY* CLEAR Final   Specific Gravity, Urine 01/16/2020 1.023  1.005 - 1.030 Final   pH 01/16/2020 6.0  5.0 - 8.0 Final   Glucose, UA 01/16/2020 >=500* NEGATIVE mg/dL Final   Hgb urine  dipstick 01/16/2020 LARGE* NEGATIVE Final   Bilirubin Urine 01/16/2020 NEGATIVE  NEGATIVE Final   Ketones, ur 01/16/2020 NEGATIVE  NEGATIVE mg/dL Final   Protein, ur 17/40/8144 NEGATIVE  NEGATIVE mg/dL Final   Nitrite 81/85/6314 POSITIVE* NEGATIVE Final   Leukocytes,Ua 01/16/2020 SMALL* NEGATIVE Final   RBC / HPF 01/16/2020 >50* 0 - 5 RBC/hpf Final   WBC, UA 01/16/2020 >50* 0 - 5 WBC/hpf Final   Bacteria, UA 01/16/2020 RARE* NONE  SEEN Final   Squamous Epithelial / LPF 01/16/2020 0-5  0 - 5 Final   Performed at [facility], [address], [place], [place] [address]   Opiates 01/16/2020 NONE DETECTED  NONE DETECTED Final   Cocaine 01/16/2020 NONE DETECTED  NONE DETECTED Final   Benzodiazepines 01/16/2020 NONE DETECTED  NONE DETECTED Final   Amphetamines 01/16/2020 NONE DETECTED  NONE DETECTED Final   Tetrahydrocannabinol 01/16/2020 NONE DETECTED  NONE DETECTED Final   Barbiturates 01/16/2020 NONE DETECTED  NONE DETECTED Final   Comment: (NOTE) DRUG SCREEN FOR MEDICAL PURPOSES ONLY.  IF CONFIRMATION IS NEEDED FOR ANY PURPOSE, NOTIFY LAB WITHIN 5 DAYS. LOWEST DETECTABLE LIMITS FOR URINE DRUG SCREEN Drug Class                     Cutoff (ng/mL) Amphetamine and metabolites    1000 Barbiturate and metabolites    200 Benzodiazepine                 200 Tricyclics and metabolites     300 Opiates and metabolites        300 Cocaine and metabolites        300 THC                            50 Performed at [facility], [address] [place], [place] [address]    Alcohol, Ethyl (B) 01/16/2020 <10  <10 mg/dL Final   Comment: (NOTE) Lowest detectable limit for serum alcohol is 10 mg/dL. For medical purposes only. Performed at [facility], [address], [place], [place] [address]    I-stat hCG, quantitative 01/16/2020 7.2* <5 mIU/mL Final   Comment 3 01/16/2020          Final   Comment:   GEST. AGE      CONC.  (mIU/mL)   <=1 WEEK        5 - 50     2 WEEKS       50 - 500     3 WEEKS       100 - 10,000     4 WEEKS     1,000 - 30,000        FEMALE AND NON-PREGNANT FEMALE:     LESS THAN 5 mIU/mL    Sodium 01/16/2020 130* 135 - 145 mmol/L Final   Potassium 01/16/2020 4.5  3.5 - 5.1 mmol/L Final   Chloride 01/16/2020 94* 98 - 111 mmol/L Final   CO2 01/16/2020 24  22 - 32 mmol/L Final   Glucose, Bld 01/16/2020 509* 70 - 99 mg/dL Final    Comment: Glucose reference range applies only to samples taken after fasting for at least 8 hours. CRITICAL RESULT CALLED TO, READ BACK BY AND VERIFIED WITH: B.BARRY,RN [id number] @0608  BY V.WILKINS    BUN 01/16/2020 6  6 - 20 mg/dL Final   Creatinine, Ser 01/16/2020 0.84  0.44 - 1.00 mg/dL Final  Calcium 01/16/2020 8.7* 8.9 - 10.3 mg/dL Final   Total Protein 16/05/9603 7.2  6.5 - 8.1 g/dL Final   Albumin 54/04/8118 3.0* 3.5 - 5.0 g/dL Final   AST 14/78/2956 33  15 - 41 U/L Final   ALT 01/16/2020 19  0 - 44 U/L Final   Alkaline Phosphatase 01/16/2020 92  38 - 126 U/L Final   Total Bilirubin 01/16/2020 0.8  0.3 - 1.2 mg/dL Final   GFR calc non Af Amer 01/16/2020 >60  >60 mL/min Final   GFR calc Af Amer 01/16/2020 >60  >60 mL/min Final   Anion gap 01/16/2020 12  5 - 15 Final   Performed at [facility], [address], [place], [place] [address]   hCG, Conley Rolls, Quant, S 01/16/2020 1  <5 mIU/mL Final   Comment:          GEST. AGE      CONC.  (mIU/mL)   <=1 WEEK        5 - 50     2 WEEKS       50 - 500     3 WEEKS       100 - 10,000     4 WEEKS     1,000 - 30,000     5 WEEKS     3,500 - 115,000   6-8 WEEKS     12,000 - 270,000    12 WEEKS     15,000 - 220,000        FEMALE AND NON-PREGNANT FEMALE:     LESS THAN 5 mIU/mL Performed at [facility], [address], [place], [place] [address]   Admission on 01/13/2020, Discharged on 01/14/2020  Component Date Value Ref Range Status   WBC 01/13/2020 9.9  4.0 - 10.5 K/uL Final   RBC 01/13/2020 5.21* 3.87 - 5.11 MIL/uL Final   Hemoglobin 01/13/2020 14.3  12.0 - 15.0 g/dL Final   HCT 69/62/9528 43.5  36 - 46 % Final   MCV 01/13/2020 83.5  80.0 - 100.0 fL Final   MCH 01/13/2020 27.4  26.0 - 34.0 pg Final   MCHC 01/13/2020 32.9  30.0 - 36.0 g/dL Final   RDW 41/32/4401 13.3  11.5 - 15.5 % Final   Platelets 01/13/2020 229  150 - 400 K/uL Final   nRBC 01/13/2020 0.0  0.0 - 0.2 %  Final   Neutrophils Relative % 01/13/2020 80  % Final   Neutro Abs 01/13/2020 8.0* 1.7 - 7.7 K/uL Final   Lymphocytes Relative 01/13/2020 8  % Final   Lymphs Abs 01/13/2020 0.8  0.7 - 4.0 K/uL Final   Monocytes Relative 01/13/2020 11  % Final   Monocytes Absolute 01/13/2020 1.1* 0 - 1 K/uL Final   Eosinophils Relative 01/13/2020 0  % Final   Eosinophils Absolute 01/13/2020 0.0  0 - 0 K/uL Final   Basophils Relative 01/13/2020 0  % Final   Basophils Absolute 01/13/2020 0.0  0 - 0 K/uL Final   Immature Granulocytes 01/13/2020 1  % Final   Abs Immature Granulocytes 01/13/2020 0.05  0.00 - 0.07 K/uL Final   Performed at [facility], [address] [place], [place] [address]   Sodium 01/13/2020 132* 135 - 145 mmol/L Final   Potassium 01/13/2020 3.9  3.5 - 5.1 mmol/L Final   Chloride 01/13/2020 99  98 - 111 mmol/L Final   CO2 01/13/2020 25  22 - 32 mmol/L Final   Glucose, Bld  01/13/2020 434* 70 - 99 mg/dL Final   Glucose reference range applies only to samples taken after fasting for at least 8 hours.   BUN 01/13/2020 8  6 - 20 mg/dL Final   Creatinine, Ser 01/13/2020 0.83  0.44 - 1.00 mg/dL Final   Calcium 16/05/9603 8.7* 8.9 - 10.3 mg/dL Final   Total Protein 54/04/8118 6.8  6.5 - 8.1 g/dL Final   Albumin 14/78/2956 3.1* 3.5 - 5.0 g/dL Final   AST 21/30/8657 17  15 - 41 U/L Final   ALT 01/13/2020 15  0 - 44 U/L Final   Alkaline Phosphatase 01/13/2020 72  38 - 126 U/L Final   Total Bilirubin 01/13/2020 0.3  0.3 - 1.2 mg/dL Final   GFR calc non Af Amer 01/13/2020 >60  >60 mL/min Final   GFR calc Af Amer 01/13/2020 >60  >60 mL/min Final   Anion gap 01/13/2020 8  5 - 15 Final   Performed at [facility], [address] [place], [place] [address]   Lactic Acid, Venous 01/13/2020 2.5* 0.5 - 1.9 mmol/L Final   Comment: CRITICAL RESULT CALLED TO, READ BACK BY AND VERIFIED WITH: HOLLY ACIVITA @ 2220 ON 01/13/20 C  VARNER Performed at [facility], [address] [place], [place] [address]    Color, Urine 01/13/2020 YELLOW  YELLOW Final   APPearance 01/13/2020 CLEAR  CLEAR Final   Specific Gravity, Urine 01/13/2020 1.026  1.005 - 1.030 Final   pH 01/13/2020 6.0  5.0 - 8.0 Final   Glucose, UA 01/13/2020 >=500* NEGATIVE mg/dL Final   Hgb urine dipstick 01/13/2020 NEGATIVE  NEGATIVE Final   Bilirubin Urine 01/13/2020 NEGATIVE  NEGATIVE Final   Ketones, ur 01/13/2020 NEGATIVE  NEGATIVE mg/dL Final   Protein, ur 41/32/4401 NEGATIVE  NEGATIVE mg/dL Final   Nitrite 02/72/5366 NEGATIVE  NEGATIVE Final   Leukocytes,Ua 01/13/2020 SMALL* NEGATIVE Final   RBC / HPF 01/13/2020 0-5  0 - 5 RBC/hpf Final   WBC, UA 01/13/2020 >50* 0 - 5 WBC/hpf Final   Bacteria, UA 01/13/2020 RARE* NONE SEEN Final   Squamous Epithelial / LPF 01/13/2020 0-5  0 - 5 Final   Mucus 01/13/2020 PRESENT   Final   Performed at [facility], [address], [place], [place] [address]   Specimen Description 01/13/2020    Final                   Value:URINE, CLEAN CATCH Performed at [facility], [address] [place], [place] [address]    Special Requests 01/13/2020    Final                   Value:NONE Performed at [facility], [address], [place], [place] [address]    Culture 01/13/2020 >=100,000 COLONIES/mL ESCHERICHIA COLI*  Final   Report Status 01/13/2020 01/16/2020 FINAL   Final   Organism ID, Bacteria 01/13/2020 ESCHERICHIA COLI*  Final   Glucose-Capillary 01/14/2020 191* 70 - 99 mg/dL Final   Glucose reference range applies only to samples taken after fasting for at least 8 hours.   Glucose-Capillary 01/14/2020 226* 70 - 99 mg/dL Final   Glucose reference range applies only to samples taken after fasting for at least 8 hours.  There may be more visits with results that are not included.    Allergies: Risperdal  [risperidone] and 5-alpha reductase inhibitors  PTA Medications: (Not in a hospital admission)   Medical Decision  Making  Admitted to continuous assessment unit Trazodone 50 mg for sleep Ativan 25 mg Tid prn for anxiety Ordered HgbA1c Peer Support consult to assist with substance use services, rehab, and halfway house.  Patient may be a good candidate for the [employer] at [facility]. Social work consult order to assist patient with community resources.  Setting up for outpatient psychiatric services (CD/IOP) therapy.  IRC, Resources for primary provider (Dx with diabetes) and not receiving treatment.      Recommendations  Based on my evaluation the patient does not appear to have an emergency medical condition.  Keani Gotcher, NP 03/16/20  2:59 PM

## 2020-03-16 NOTE — BH Assessment (Signed)
Clinician attempted to see patient.  Pt too somnolent to be assessed at this time.

## 2020-03-16 NOTE — ED Provider Notes (Signed)
[facility] DEPT Provider Note   CSN: [id number] Arrival date & time: 03/15/20  2125     History Chief Complaint  Patient presents with   Psychiatric Evaluation   Hyperglycemia   Homeless    Dana Noble is a 34 y.o. female with a hx of NIDDM (untreated), substance abuse, schizophrenia presents to the Emergency Department complaining of gradual, persistent, progressively worsening auditory and visual hallucinations onset several weeks ago, but worsening over the last several days.  Pt reports she stopped using crack cocaine approx 1 week ago and symptoms began to worsen at that time.  She reports over the last few days she has developed suicidal ideation.  She denies a specific suicide plan at this time.  She also denies self-harm.  No specific aggravating or alleviating factors.  Patient is requesting help with her mental health.  Denies headache, neck pain, chest pain, shortness of breath, abdominal pain, nausea, vomiting, diarrhea, weakness, dizziness, syncope, fever, chills.  The history is provided by the patient and medical records. No language interpreter was used.       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     Family History  Family history unknown: Yes    Social History   Tobacco Use   Smoking status: Never Smoker   Smokeless tobacco: Never Used  Scientific laboratory technician Use: Never used  Substance Use Topics   Alcohol use: Not Currently   Drug use: Not Currently    Home Medications Prior to Admission  medications   Medication Sig Start Date End Date Taking? Authorizing Provider  cephALEXin (KEFLEX) 500 MG capsule 1 cap po bid x 7 days 03/16/20   Dmya Long, Jarrett Soho, PA-C  glucose monitoring kit (FREESTYLE) monitoring kit 1 each by Does not apply route as needed for other. 01/20/20   Petrucelli, Samantha R, PA-C  metFORMIN (GLUCOPHAGE) 500 MG tablet Take 1 tablet (500 mg total) by mouth 2 (two) times daily with a meal. 01/26/20   Palumbo, April, MD  ondansetron (ZOFRAN ODT) 4 MG disintegrating tablet Take 1 tablet (4 mg total) by mouth every 8 (eight) hours as needed for nausea or vomiting. 01/29/20   Petrucelli, Samantha R, PA-C    Allergies    5-alpha reductase inhibitors  Review of Systems   Review of Systems  Constitutional: Negative for appetite change, diaphoresis, fatigue, fever and unexpected weight change.  HENT: Negative for mouth sores.   Eyes: Negative for visual disturbance.  Respiratory: Negative for cough, chest tightness, shortness of breath and wheezing.   Cardiovascular: Negative for chest pain.  Gastrointestinal: Negative for abdominal pain, constipation, diarrhea, nausea and vomiting.  Endocrine: Negative for polydipsia, polyphagia and polyuria.  Genitourinary: Negative for dysuria, frequency, hematuria and urgency.  Musculoskeletal: Negative for back pain and neck stiffness.  Skin: Negative for rash.  Allergic/Immunologic: Negative for immunocompromised state.  Neurological: Negative for syncope, light-headedness and headaches.  Hematological: Does not bruise/bleed easily.  Psychiatric/Behavioral: Positive for hallucinations and suicidal ideas. Negative for sleep  disturbance. The patient is not nervous/anxious.     Physical Exam Updated Vital Signs BP (!) 135/78 (BP Location: Left Arm)    Pulse 80    Temp 98.8 F (37.1 C) (Oral)    Resp 19    Ht '5\' 11"'$  (1.803 m)    Wt (!) 93.9 kg    SpO2 100%    BMI 28.87 kg/m   Physical Exam Vitals and nursing note reviewed.    Constitutional:      General: She is not in acute distress.    Appearance: She is not diaphoretic.  HENT:     Head: Normocephalic.  Eyes:     General: No scleral icterus.    Conjunctiva/sclera: Conjunctivae normal.  Cardiovascular:     Rate and Rhythm: Normal rate and regular rhythm.     Pulses: Normal pulses.          Radial pulses are 2+ on the right side and 2+ on the left side.  Pulmonary:     Effort: No tachypnea, accessory muscle usage, prolonged expiration, respiratory distress or retractions.     Breath sounds: No stridor.     Comments: Equal chest rise. No increased work of breathing. Abdominal:     General: There is no distension.     Palpations: Abdomen is soft.     Tenderness: There is no abdominal tenderness. There is no guarding or rebound.  Musculoskeletal:     Cervical back: Normal range of motion.     Comments: Moves all extremities equally and without difficulty.  Skin:    General: Skin is warm and dry.     Capillary Refill: Capillary refill takes less than 2 seconds.  Neurological:     Mental Status: She is alert.     GCS: GCS eye subscore is 4. GCS verbal subscore is 5. GCS motor subscore is 6.     Comments: Speech is clear and goal oriented.  Psychiatric:        Mood and Affect: Mood is anxious.        Speech: Speech is rapid and pressured.        Thought Content: Thought content includes suicidal ideation. Thought content does not include homicidal ideation. Thought content does not include homicidal or suicidal plan.     ED Results / Procedures / Treatments   Labs (all labs ordered are listed, but only abnormal results are displayed) Labs Reviewed  COMPREHENSIVE METABOLIC PANEL - Abnormal; Notable for the following components:      Result Value   Glucose, Bld 281 (*)    AST 13 (*)    Total Bilirubin 0.2 (*)    All other components within normal limits  CBC - Abnormal; Notable for the following components:   Platelets 427 (*)    All other  components within normal limits  URINALYSIS, ROUTINE W REFLEX MICROSCOPIC - Abnormal; Notable for the following components:   Glucose, UA >=500 (*)    Leukocytes,Ua MODERATE (*)    Bacteria, UA RARE (*)    All other components within normal limits  URINE CULTURE  ETHANOL  RAPID URINE DRUG SCREEN, HOSP PERFORMED  I-STAT BETA HCG BLOOD, ED ([facility], [facility], [facility] ONLY)  CBG MONITORING, ED    Procedures Procedures (including critical care time)  Medications Ordered in ED Medications  cephALEXin (KEFLEX) capsule 500 mg (has no administration in time range)    ED Course  I have reviewed the triage vital signs and the nursing notes.  Pertinent labs &  imaging results that were available during my care of the patient were reviewed by me and considered in my medical decision making (see chart for details).    MDM Rules/Calculators/A&P                           Patient presents to the emergency department requesting mental health assistance.  She states a history of schizophrenia for which she is unmedicated.  Additionally has a history of diabetes which has been unmedicated for some time.  Labs today are reassuring.  Blood sugar 281 without anion gap.  No evidence of DKA.  UA does show some concerns for possible UTI.  Treatment initiated here in the emergency department.  Patient will be transferred to [facility] for further evaluation.  2:31 AM Discussed with Talbot Grumbling, NP who accepts patient in transfer.  She requests TTS consult be placed.   Final Clinical Impression(s) / ED Diagnoses Final diagnoses:  Suicidal ideation  Schizophrenia, unspecified type ([place])  Homeless  Hyperglycemia  Urinary tract infection without hematuria, site unspecified    Rx / DC Orders ED Discharge Orders         Ordered    cephALEXin (KEFLEX) 500 MG capsule     Discontinue  Reprint     03/16/20 0235           Binh Doten, Jarrett Soho, PA-C 03/16/20 [place], April, MD 03/16/20 8242

## 2020-03-16 NOTE — ED Notes (Signed)
Pt given meal

## 2020-03-16 NOTE — ED Notes (Signed)
[place]ocker # 4

## 2020-03-16 NOTE — BH Assessment (Addendum)
BHH Assessment Progress Note  Per Shuvon Rankin, FNP, pt is to be is transferred to the [facility].  Reola Calkins, NP and [place], RN agree to accept pt, but at [employer] asks for 30 minute delay before pt is transferred.  Please call report to [phone].  Pt is to be transported via [employer].  Pt's nurse has been notified.  Doylene Canning, [place] Behavioral Health Coordinator [phone]

## 2020-03-16 NOTE — ED Notes (Signed)
Just woke up PT for vitals and asked her if she could give urine, She stated that she didn't have any to give and rolled back over to sleep.

## 2020-03-16 NOTE — ED Notes (Signed)
Pt given meal, snacks and Sprite Zero.

## 2020-03-16 NOTE — ED Notes (Signed)
Pt laying in bed with head covered responding to hallucinations with laughter.

## 2020-03-16 NOTE — Discharge Instructions (Addendum)
Continue medications as prescribed.

## 2020-03-16 NOTE — Discharge Instructions (Addendum)
You are being transferred to the behavioral health urgent care.  You do have a urinary tract infection and will need to fill and take your antibiotic which is attached to this paperwork.

## 2020-03-16 NOTE — ED Notes (Signed)
Pt A&O x 4, no distress noted, pt showered, comfort measures given.  Monitoring for safety, no distress noted, calm & cooperative at present.

## 2020-03-17 DIAGNOSIS — F4325 Adjustment disorder with mixed disturbance of emotions and conduct: Secondary | ICD-10-CM

## 2020-03-17 DIAGNOSIS — R440 Auditory hallucinations: Secondary | ICD-10-CM | POA: Diagnosis not present

## 2020-03-17 LAB — GLUCOSE, CAPILLARY: Glucose-Capillary: 184 mg/dL — ABNORMAL HIGH (ref 70–99)

## 2020-03-17 MED ORDER — METFORMIN HCL 500 MG PO TABS: 500.0000 mg | ORAL_TABLET | Freq: Two times a day (BID) | ORAL | 0 refills | Status: AC

## 2020-03-17 NOTE — ED Notes (Signed)
Pt cooperative, anxious, fidgety in room. Eating cereal. Requesting a shower. Denies SI/HI at present. Safety maintained.

## 2020-03-17 NOTE — ED Notes (Signed)
Pt resting at present, no distress noted, monitoring for safety. 

## 2020-03-17 NOTE — ED Notes (Signed)
Pt is showering  ?

## 2020-03-17 NOTE — ED Notes (Signed)
Pt taking another shower. No acute distress noted.

## 2020-03-17 NOTE — Progress Notes (Signed)
Pt has agreed to engage in treatment at the [facility] in [place], [place]. Transportation has been arranged.   Wells Guiles, LCSW, LCAS Disposition CSW [facility]/TTS [phone]

## 2020-03-17 NOTE — ED Notes (Signed)
Pt discharged in no acute distress. Verbalized understanding of all discharge instructions reviewed by RN. RX given. Denies SI/HI. Pt escorted to area for safe transport service to transport pt to [facility]. Safety maintained.

## 2020-03-17 NOTE — ED Provider Notes (Signed)
FBC/OBS ASAP Discharge Summary  Date and Time: 03/17/2020 59:56 AM  Name: Dana Noble  MRN:  [id number]   Discharge Diagnoses:  Final diagnoses:  None    Subjective: Patient reports this morning that she is feeling better.  She states that she slept well last night and she has been eating good.  Patient reports that her biggest concern is having someone to go.  She states that she has been living in [place] for about the last 6 to 7 months and came here after being released from jail.  She states that she was incarcerated due to trespassing but those charges were dropped.  She reports that she has a safe place to live that she will be safe and has no thoughts of suicidal ideations or homicidal ideations and denies any hallucinations today.  Stay Summary: Patient is a 34 year old female who presented to the [facility] as a direct admit from [facility] with complaints of auditory and visual hallucinations and seeking assistance with outpatient psych services.  Patient was admitted to the continuous observation unit to be monitored overnight.  Patient was eating well, slept very well.  Patient was also seen taking a shower last night and this morning.  Patient was offered psychiatric services through the [facility] behavioral health center and stated that she was referred to go to the [facility] rescue mission and receive services there as she would also receive housing.  The patient has denied any suicidal or homicidal ideations and denies any hallucinations.  At this time the patient does not meet inpatient psychiatric treatment criteria and will be discharged to the [facility] rescue mission with prescription and samples of her metformin.  Transportation will be provided to her about safe transport to [place] [facility].  Total Time spent with patient: 30 minutes  Past Psychiatric History: Cocaine abuse/dependence, cocaine induce mood disorder  Past Medical History:  Past Medical History:  Diagnosis  Date  . Diabetes mellitus without complication ([place])   . Diabetes mellitus, type II ([place])   . Substance abuse [facility])     Past Surgical History:  Procedure Laterality Date  . KNEE SURGERY    . WRIST SURGERY     Family History:  Family History  Family history unknown: Yes   Family Psychiatric History: None reported Social History:  Social History   Substance and Sexual Activity  Alcohol Use Not Currently     Social History   Substance and Sexual Activity  Drug Use Not Currently    Social History   Socioeconomic History  . Marital status: Single    Spouse name: Not on file  . Number of children: Not on file  . Years of education: Not on file  . Highest education level: Not on file  Occupational History  . Occupation: unemployed  Tobacco Use  . Smoking status: Never Smoker  . Smokeless tobacco: Never Used  Vaping Use  . Vaping Use: Never used  Substance and Sexual Activity  . Alcohol use: Not Currently  . Drug use: Not Currently  . Sexual activity: Not Currently  Other Topics Concern  . Not on file  Social History Narrative  . Not on file   Social Determinants of Health   Financial Resource Strain:   . Difficulty of Paying Living Expenses:   Food Insecurity:   . Worried About Charity fundraiser in the Last Year:   . Arboriculturist in the Last Year:   Transportation Needs:   .  Lack of Transportation (Medical):   Marland Kitchen Lack of Transportation (Non-Medical):   Physical Activity:   . Days of Exercise per Week:   . Minutes of Exercise per Session:   Stress:   . Feeling of Stress :   Social Connections:   . Frequency of Communication with Friends and Family:   . Frequency of Social Gatherings with Friends and Family:   . Attends Religious Services:   . Active Member of Clubs or Organizations:   . Attends Archivist Meetings:   Marland Kitchen Marital Status:    SDOH:  SDOH Screenings   Alcohol Screen:   . Last Alcohol Screening Score (AUDIT):   Depression  (PHQ2-9):   . PHQ-2 Score:   Financial Resource Strain:   . Difficulty of Paying Living Expenses:   Food Insecurity:   . Worried About Charity fundraiser in the Last Year:   . [place] in the Last Year:   Housing:   . Last Housing Risk Score:   Physical Activity:   . Days of Exercise per Week:   . Minutes of Exercise per Session:   Social Connections:   . Frequency of Communication with Friends and Family:   . Frequency of Social Gatherings with Friends and Family:   . Attends Religious Services:   . Active Member of Clubs or Organizations:   . Attends Archivist Meetings:   Marland Kitchen Marital Status:   Stress:   . Feeling of Stress :   Tobacco Use: Low Risk   . Smoking Tobacco Use: Never Smoker  . Smokeless Tobacco Use: Never Used  Transportation Needs:   . Film/video editor (Medical):   Marland Kitchen Lack of Transportation (Non-Medical):     Has this patient used any form of tobacco in the last 30 days? (Cigarettes, Smokeless Tobacco, Cigars, and/or Pipes) A prescription for an FDA-approved tobacco cessation medication was offered at discharge and the patient refused  Current Medications:  Current Facility-Administered Medications  Medication Dose Route Frequency Provider Last Rate Last Admin  . insulin aspart (novoLOG) injection 0-9 Units  0-9 Units Subcutaneous TID WC Rankin, Shuvon B, NP   2 Units at 03/17/20 0751  . metFORMIN (GLUCOPHAGE) tablet 500 mg  500 mg Oral BID WC Rankin, Shuvon B, NP   500 mg at 03/17/20 3300   Current Outpatient Medications  Medication Sig Dispense Refill  . cephALEXin (KEFLEX) 500 MG capsule 1 cap po bid x 7 days 14 capsule 0  . glucose monitoring kit (FREESTYLE) monitoring kit 1 each by Does not apply route as needed for other. 1 each 0  . metFORMIN (GLUCOPHAGE) 500 MG tablet Take 1 tablet (500 mg total) by mouth 2 (two) times daily with a meal. 60 tablet 0  . ondansetron (ZOFRAN ODT) 4 MG disintegrating tablet Take 1 tablet (4 mg total)  by mouth every 8 (eight) hours as needed for nausea or vomiting. 5 tablet 0    PTA Medications: (Not in a hospital admission)   Musculoskeletal  Strength & Muscle Tone: within normal limits Gait & Station: normal Patient leans: N/A  Psychiatric Specialty Exam  Presentation  General Appearance: Appropriate for Environment;Casual;Fairly Groomed  Eye Contact:Fair  Speech:Clear and Coherent;Normal Rate  Speech Volume:Normal  Handedness:Right   Mood and Affect  Mood:Euthymic  Affect:Congruent   Thought Process  Thought Processes:Coherent  Descriptions of Associations:Intact  Orientation:Full (Time, Place and Person)  Thought Content:WDL  Hallucinations:Hallucinations: None  Ideas of Reference:None  Suicidal Thoughts:Suicidal Thoughts: No  Homicidal Thoughts:Homicidal Thoughts: No   Sensorium  Memory:Immediate Good;Recent Good;Remote Good  Judgment:Fair  Insight:Fair   Executive Functions  Concentration:Good  Attention Span:Good  [place]  Language:Good   Psychomotor Activity  Psychomotor Activity:Psychomotor Activity: Normal   Assets  Assets:Communication Skills;Desire for Improvement;Transportation   Sleep  Sleep:Sleep: Good   Physical Exam  Physical Exam Vitals and nursing note reviewed.  Constitutional:      Appearance: She is well-developed.  Cardiovascular:     Rate and Rhythm: Normal rate.  Pulmonary:     Effort: Pulmonary effort is normal.  Musculoskeletal:        General: Normal range of motion.  Skin:    General: Skin is warm.  Neurological:     Mental Status: She is alert and oriented to person, place, and time.    Review of Systems  Constitutional: Negative.   HENT: Negative.   Eyes: Negative.   Respiratory: Negative.   Cardiovascular: Negative.   Gastrointestinal: Negative.   Genitourinary: Negative.   Musculoskeletal: Negative.   Skin: Negative.   Neurological: Negative.    Endo/Heme/Allergies: Negative.   Psychiatric/Behavioral: Positive for depression. Negative for suicidal ideas.   Blood pressure 116/69, pulse 81, temperature 97.8 F (36.6 C), temperature source Oral, resp. rate 16, height '5\' 9"'$  (1.753 m), weight (!) 97.5 kg, SpO2 100 %. Body mass index is 31.75 kg/m.  Demographic Factors:  Low socioeconomic status and Unemployed  Loss Factors: NA  Historical Factors: NA  Risk Reduction Factors:   Living with another person, especially a relative  Continued Clinical Symptoms:  Unstable or Poor Therapeutic Relationship  Cognitive Features That Contribute To Risk:  None    Suicide Risk:  Mild:  Suicidal ideation of limited frequency, intensity, duration, and specificity.  There are no identifiable plans, no associated intent, mild dysphoria and related symptoms, good self-control (both objective and subjective assessment), few other risk factors, and identifiable protective factors, including available and accessible social support.  Plan Of Care/Follow-up recommendations:  Continue activity as tolerated. Continue diet as recommended by your PCP. Ensure to keep all appointments with outpatient providers.  Disposition: Discharge to [facility]. Provided samples and prescription for Metformin. Transportation provided by [employer], FNP 03/17/2020, 10:01 AM

## 2020-03-19 LAB — URINE CULTURE: Culture: >=100000 — AB

## 2020-03-20 ENCOUNTER — Telehealth: Payer: Self-pay

## 2020-03-20 NOTE — Telephone Encounter (Signed)
Post ED Visit - Positive Culture Follow-up  Culture report reviewed by antimicrobial stewardship pharmacist: [facility] []  , Pharm.D. []  Enzo Bi, Pharm.D., BCPS AQ-ID []  , Pharm.D., BCPS []  Celedonio Miyamoto, Pharm.D., BCPS []  [place], Garvin Fila.D., BCPS, AAHIVP []  , Pharm.D., BCPS, AAHIVP []  Georgina Pillion, PharmD, BCPS []  , PharmD, BCPS []  [place], PharmD, BCPS []  [place], PharmD []  , PharmD, BCPS []  Estella Husk, PharmD  Pharmacy Team []  Lysle Pearl, PharmD []  , PharmD []  Phillips Climes, PharmD []  , Rph []  Agapito Games) , PharmD []  Verlan Friends, PharmD []  , PharmD []  Mervyn Gay, PharmD []  , PharmD []  Vinnie Level, PharmD []  Wonda Olds, PharmD []  , PharmD []  Len Childs, PharmD  Scott Cluth Pharm D Positive urine culture Treated with Cehalexin, organism sensitive to the same and no further patient follow-up is required at this time.  03/20/2020, 9:49 AM

## 2021-08-28 ENCOUNTER — Other Ambulatory Visit (HOSPITAL_COMMUNITY): Payer: Medicare Other

## 2021-08-28 ENCOUNTER — Inpatient Hospital Stay
Admission: RE | Admit: 2021-08-28 | Discharge: 2021-10-29 | Disposition: A | Discharge status: Skilled Nursing Facility | Payer: Medicare Other | Source: Other Acute Inpatient Hospital | Attending: Internal Medicine | Admitting: Internal Medicine

## 2021-08-28 DIAGNOSIS — J9690 Respiratory failure, unspecified, unspecified whether with hypoxia or hypercapnia: Secondary | ICD-10-CM

## 2021-08-28 DIAGNOSIS — Z4659 Encounter for fitting and adjustment of other gastrointestinal appliance and device: Secondary | ICD-10-CM

## 2021-08-28 DIAGNOSIS — K599 Functional intestinal disorder, unspecified: Secondary | ICD-10-CM

## 2021-08-28 DIAGNOSIS — R1110 Vomiting, unspecified: Secondary | ICD-10-CM

## 2021-08-28 DIAGNOSIS — Z930 Tracheostomy status: Secondary | ICD-10-CM

## 2021-08-28 DIAGNOSIS — J9621 Acute and chronic respiratory failure with hypoxia: Secondary | ICD-10-CM

## 2021-08-28 DIAGNOSIS — S299XXA Unspecified injury of thorax, initial encounter: Secondary | ICD-10-CM

## 2021-08-28 DIAGNOSIS — R1310 Dysphagia, unspecified: Secondary | ICD-10-CM

## 2021-08-28 DIAGNOSIS — R4182 Altered mental status, unspecified: Secondary | ICD-10-CM

## 2021-08-28 DIAGNOSIS — R509 Fever, unspecified: Secondary | ICD-10-CM

## 2021-08-28 DIAGNOSIS — J398 Other specified diseases of upper respiratory tract: Secondary | ICD-10-CM

## 2021-08-28 DIAGNOSIS — R9389 Abnormal findings on diagnostic imaging of other specified body structures: Secondary | ICD-10-CM

## 2021-08-29 DIAGNOSIS — J398 Other specified diseases of upper respiratory tract: Secondary | ICD-10-CM

## 2021-08-29 DIAGNOSIS — Z930 Tracheostomy status: Secondary | ICD-10-CM

## 2021-08-29 DIAGNOSIS — S299XXS Unspecified injury of thorax, sequela: Secondary | ICD-10-CM

## 2021-08-29 DIAGNOSIS — J9621 Acute and chronic respiratory failure with hypoxia: Secondary | ICD-10-CM

## 2021-08-29 DIAGNOSIS — R4182 Altered mental status, unspecified: Secondary | ICD-10-CM | POA: Diagnosis not present

## 2021-08-29 LAB — PHOSPHORUS: Phosphorus: 4.3 mg/dL (ref 2.5–4.6)

## 2021-08-29 LAB — CBC WITH DIFFERENTIAL/PLATELET
Abs Immature Granulocytes: 0.01 10*3/uL (ref 0.00–0.07)
Basophils Absolute: 0.1 10*3/uL (ref 0.0–0.1)
Basophils Relative: 1 %
Eosinophils Absolute: 0.3 10*3/uL (ref 0.0–0.5)
Eosinophils Relative: 3 %
HCT: 33.9 % — ABNORMAL LOW (ref 36.0–46.0)
Hemoglobin: 10.4 g/dL — ABNORMAL LOW (ref 12.0–15.0)
Immature Granulocytes: 0 %
Lymphocytes Relative: 22 %
Lymphs Abs: 2 10*3/uL (ref 0.7–4.0)
MCH: 26.9 pg (ref 26.0–34.0)
MCHC: 30.7 g/dL (ref 30.0–36.0)
MCV: 87.8 fL (ref 80.0–100.0)
Monocytes Absolute: 0.7 10*3/uL (ref 0.1–1.0)
Monocytes Relative: 7 %
Neutro Abs: 5.9 10*3/uL (ref 1.7–7.7)
Neutrophils Relative %: 67 %
Platelets: 607 10*3/uL — ABNORMAL HIGH (ref 150–400)
RBC: 3.86 MIL/uL — ABNORMAL LOW (ref 3.87–5.11)
RDW: 18.1 % — ABNORMAL HIGH (ref 11.5–15.5)
WBC: 8.8 10*3/uL (ref 4.0–10.5)
nRBC: 0 % (ref 0.0–0.2)

## 2021-08-29 LAB — COMPREHENSIVE METABOLIC PANEL
ALT: 15 U/L (ref 0–44)
AST: 17 U/L (ref 15–41)
Albumin: 2.7 g/dL — ABNORMAL LOW (ref 3.5–5.0)
Alkaline Phosphatase: 148 U/L — ABNORMAL HIGH (ref 38–126)
Anion gap: 10 (ref 5–15)
BUN: 18 mg/dL (ref 6–20)
CO2: 30 mmol/L (ref 22–32)
Calcium: 8.9 mg/dL (ref 8.9–10.3)
Chloride: 103 mmol/L (ref 98–111)
Creatinine, Ser: 0.44 mg/dL (ref 0.44–1.00)
GFR, Estimated: >60 mL/min (ref >60)
Glucose, Bld: 150 mg/dL — ABNORMAL HIGH (ref 70–99)
Potassium: 4 mmol/L (ref 3.5–5.1)
Sodium: 143 mmol/L (ref 135–145)
Total Bilirubin: 0.3 mg/dL (ref 0.3–1.2)
Total Protein: 8.1 g/dL (ref 6.5–8.1)

## 2021-08-29 LAB — PROTIME-INR
INR: 1 (ref 0.8–1.2)
Prothrombin Time: 13.4 seconds (ref 11.4–15.2)

## 2021-08-29 LAB — MAGNESIUM: Magnesium: 1.9 mg/dL (ref 1.7–2.4)
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
## 2021-08-30 ENCOUNTER — Other Ambulatory Visit (HOSPITAL_COMMUNITY): Payer: Medicare Other

## 2021-08-30 DIAGNOSIS — J9621 Acute and chronic respiratory failure with hypoxia: Secondary | ICD-10-CM | POA: Diagnosis not present

## 2021-08-30 DIAGNOSIS — S299XXS Unspecified injury of thorax, sequela: Secondary | ICD-10-CM | POA: Diagnosis not present

## 2021-08-30 DIAGNOSIS — R4182 Altered mental status, unspecified: Secondary | ICD-10-CM | POA: Diagnosis not present

## 2021-08-30 DIAGNOSIS — J398 Other specified diseases of upper respiratory tract: Secondary | ICD-10-CM | POA: Diagnosis not present

## 2021-08-30 LAB — URINALYSIS, ROUTINE W REFLEX MICROSCOPIC
Bacteria, UA: NONE SEEN
Bilirubin Urine: NEGATIVE
Glucose, UA: NEGATIVE mg/dL
Hgb urine dipstick: NEGATIVE
Ketones, ur: NEGATIVE mg/dL
Nitrite: NEGATIVE
Protein, ur: NEGATIVE mg/dL
Specific Gravity, Urine: 1.021 (ref 1.005–1.030)
pH: 6 (ref 5.0–8.0)

## 2021-08-30 LAB — HEMOGLOBIN A1C
Hgb A1c MFr Bld: 6.8 % — ABNORMAL HIGH (ref 4.8–5.6)
Mean Plasma Glucose: 148.46 mg/dL
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
## 2021-08-31 DIAGNOSIS — J9621 Acute and chronic respiratory failure with hypoxia: Secondary | ICD-10-CM | POA: Diagnosis not present

## 2021-08-31 DIAGNOSIS — R4182 Altered mental status, unspecified: Secondary | ICD-10-CM | POA: Diagnosis not present

## 2021-08-31 DIAGNOSIS — S299XXS Unspecified injury of thorax, sequela: Secondary | ICD-10-CM | POA: Diagnosis not present

## 2021-08-31 DIAGNOSIS — J398 Other specified diseases of upper respiratory tract: Secondary | ICD-10-CM | POA: Diagnosis not present

## 2021-08-31 LAB — RENAL FUNCTION PANEL
Albumin: 2.5 g/dL — ABNORMAL LOW (ref 3.5–5.0)
Anion gap: 8 (ref 5–15)
BUN: 17 mg/dL (ref 6–20)
CO2: 31 mmol/L (ref 22–32)
Calcium: 9 mg/dL (ref 8.9–10.3)
Chloride: 98 mmol/L (ref 98–111)
Creatinine, Ser: 0.42 mg/dL — ABNORMAL LOW (ref 0.44–1.00)
GFR, Estimated: >60 mL/min (ref >60)
Glucose, Bld: 171 mg/dL — ABNORMAL HIGH (ref 70–99)
Phosphorus: 3.6 mg/dL (ref 2.5–4.6)
Potassium: 4.4 mmol/L (ref 3.5–5.1)
Sodium: 137 mmol/L (ref 135–145)

## 2021-08-31 LAB — URINE CULTURE: Culture: <10000 — AB

## 2021-08-31 LAB — CULTURE, RESPIRATORY W GRAM STAIN: Culture: NORMAL

## 2021-08-31 LAB — CBC
HCT: 32.6 % — ABNORMAL LOW (ref 36.0–46.0)
Hemoglobin: 9.8 g/dL — ABNORMAL LOW (ref 12.0–15.0)
MCH: 26.4 pg (ref 26.0–34.0)
MCHC: 30.1 g/dL (ref 30.0–36.0)
MCV: 87.9 fL (ref 80.0–100.0)
Platelets: 538 10*3/uL — ABNORMAL HIGH (ref 150–400)
RBC: 3.71 MIL/uL — ABNORMAL LOW (ref 3.87–5.11)
RDW: 17.3 % — ABNORMAL HIGH (ref 11.5–15.5)
WBC: 6.6 10*3/uL (ref 4.0–10.5)
nRBC: 0 % (ref 0.0–0.2)
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
## 2021-09-02 DIAGNOSIS — Z930 Tracheostomy status: Secondary | ICD-10-CM

## 2021-09-02 DIAGNOSIS — J9621 Acute and chronic respiratory failure with hypoxia: Secondary | ICD-10-CM | POA: Diagnosis not present

## 2021-09-02 DIAGNOSIS — J398 Other specified diseases of upper respiratory tract: Secondary | ICD-10-CM | POA: Diagnosis not present

## 2021-09-02 DIAGNOSIS — R4182 Altered mental status, unspecified: Secondary | ICD-10-CM | POA: Diagnosis not present

## 2021-09-02 DIAGNOSIS — S299XXS Unspecified injury of thorax, sequela: Secondary | ICD-10-CM | POA: Diagnosis not present

## 2021-09-02 DIAGNOSIS — S299XXA Unspecified injury of thorax, initial encounter: Secondary | ICD-10-CM
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
## 2021-09-03 DIAGNOSIS — S299XXS Unspecified injury of thorax, sequela: Secondary | ICD-10-CM | POA: Diagnosis not present

## 2021-09-03 DIAGNOSIS — J9621 Acute and chronic respiratory failure with hypoxia: Secondary | ICD-10-CM | POA: Diagnosis not present

## 2021-09-03 DIAGNOSIS — R4182 Altered mental status, unspecified: Secondary | ICD-10-CM | POA: Diagnosis not present

## 2021-09-03 DIAGNOSIS — J398 Other specified diseases of upper respiratory tract: Secondary | ICD-10-CM | POA: Diagnosis not present
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
## 2021-09-04 ENCOUNTER — Other Ambulatory Visit (HOSPITAL_COMMUNITY): Payer: Medicare Other

## 2021-09-04 DIAGNOSIS — J9621 Acute and chronic respiratory failure with hypoxia: Secondary | ICD-10-CM | POA: Diagnosis not present

## 2021-09-04 DIAGNOSIS — J398 Other specified diseases of upper respiratory tract: Secondary | ICD-10-CM | POA: Diagnosis not present

## 2021-09-04 DIAGNOSIS — R4182 Altered mental status, unspecified: Secondary | ICD-10-CM | POA: Diagnosis not present

## 2021-09-04 DIAGNOSIS — S299XXS Unspecified injury of thorax, sequela: Secondary | ICD-10-CM | POA: Diagnosis not present

## 2021-09-04 LAB — BASIC METABOLIC PANEL
Anion gap: 13 (ref 5–15)
BUN: 17 mg/dL (ref 6–20)
CO2: 29 mmol/L (ref 22–32)
Calcium: 9.4 mg/dL (ref 8.9–10.3)
Chloride: 99 mmol/L (ref 98–111)
Creatinine, Ser: 0.53 mg/dL (ref 0.44–1.00)
GFR, Estimated: >60 mL/min (ref >60)
Glucose, Bld: 157 mg/dL — ABNORMAL HIGH (ref 70–99)
Potassium: 3.9 mmol/L (ref 3.5–5.1)
Sodium: 141 mmol/L (ref 135–145)

## 2021-09-04 LAB — PHOSPHORUS: Phosphorus: 3.6 mg/dL (ref 2.5–4.6)

## 2021-09-04 LAB — MAGNESIUM: Magnesium: 1.9 mg/dL (ref 1.7–2.4)
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
## 2021-09-05 DIAGNOSIS — R4182 Altered mental status, unspecified: Secondary | ICD-10-CM | POA: Diagnosis not present

## 2021-09-05 DIAGNOSIS — J9621 Acute and chronic respiratory failure with hypoxia: Secondary | ICD-10-CM | POA: Diagnosis not present

## 2021-09-05 DIAGNOSIS — S299XXS Unspecified injury of thorax, sequela: Secondary | ICD-10-CM | POA: Diagnosis not present

## 2021-09-05 DIAGNOSIS — J398 Other specified diseases of upper respiratory tract: Secondary | ICD-10-CM | POA: Diagnosis not present

## 2021-09-05 LAB — BASIC METABOLIC PANEL
Anion gap: 6 (ref 5–15)
BUN: 17 mg/dL (ref 6–20)
CO2: 29 mmol/L (ref 22–32)
Calcium: 8.8 mg/dL — ABNORMAL LOW (ref 8.9–10.3)
Chloride: 101 mmol/L (ref 98–111)
Creatinine, Ser: 0.46 mg/dL (ref 0.44–1.00)
GFR, Estimated: >60 mL/min (ref >60)
Glucose, Bld: 193 mg/dL — ABNORMAL HIGH (ref 70–99)
Potassium: 4.1 mmol/L (ref 3.5–5.1)
Sodium: 136 mmol/L (ref 135–145)

## 2021-09-05 LAB — CBC
HCT: 30.7 % — ABNORMAL LOW (ref 36.0–46.0)
Hemoglobin: 9.1 g/dL — ABNORMAL LOW (ref 12.0–15.0)
MCH: 26.1 pg (ref 26.0–34.0)
MCHC: 29.6 g/dL — ABNORMAL LOW (ref 30.0–36.0)
MCV: 88 fL (ref 80.0–100.0)
Platelets: 466 10*3/uL — ABNORMAL HIGH (ref 150–400)
RBC: 3.49 MIL/uL — ABNORMAL LOW (ref 3.87–5.11)
RDW: 17 % — ABNORMAL HIGH (ref 11.5–15.5)
WBC: 7.9 10*3/uL (ref 4.0–10.5)
nRBC: 0 % (ref 0.0–0.2)

## 2021-09-05 LAB — MAGNESIUM: Magnesium: 1.9 mg/dL (ref 1.7–2.4)

## 2021-09-05 LAB — PHOSPHORUS: Phosphorus: 4.7 mg/dL — ABNORMAL HIGH (ref 2.5–4.6)
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
## 2021-09-05 NOTE — Progress Notes (Signed)
Pulmonary Critical Care Medicine [facility]   PULMONARY CRITICAL CARE SERVICE  PROGRESS NOTE     Dana Noble  HBZ:[id number]  DOB: Jul 04, 1986   DOA: 08/28/2021  Referring Physician: Luna Kitchens, MD  HPI: Dana Noble is a 36 y.o. female being followed for ventilator/airway/oxygen weaning Acute on Chronic Respiratory Failure.  Patient is comfortable without distress low-grade fever was noted  Medications: Reviewed on Rounds  Physical Exam:  Vitals: Temperature 99.2 pulse 101 respiratory 19 blood pressure is 126/76 saturations 100%  Ventilator Settings on T collar FiO2 28%  General: Comfortable at this time Neck: supple Cardiovascular: no malignant arrhythmias Respiratory: Coarse rhonchi are noted Skin: no rash seen on limited exam Musculoskeletal: No gross abnormality Psychiatric:unable to assess Neurologic:no involuntary movements         Lab Data:   Basic Metabolic Panel: Recent Labs  Lab 08/31/21 0212 09/04/21 0656  NA 137 141  K 4.4 3.9  CL 98 99  CO2 31 29  GLUCOSE 171* 157*  BUN 17 17  CREATININE 0.42* 0.53  CALCIUM 9.0 9.4  MG  --  1.9  PHOS 3.6 3.6    ABG: No results for input(s): PHART, PCO2ART, PO2ART, HCO3, O2SAT in the last 168 hours.  Liver Function Tests: Recent Labs  Lab 08/31/21 0212  ALBUMIN 2.5*   No results for input(s): LIPASE, AMYLASE in the last 168 hours. No results for input(s): AMMONIA in the last 168 hours.  CBC: Recent Labs  Lab 08/31/21 0212  WBC 6.6  HGB 9.8*  HCT 32.6*  MCV 87.9  PLT 538*    Cardiac Enzymes: No results for input(s): CKTOTAL, CKMB, CKMBINDEX, TROPONINI in the last 168 hours.  BNP (last 3 results) No results for input(s): BNP in the last 8760 hours.  ProBNP (last 3 results) No results for input(s): PROBNP in the last 8760 hours.  Radiological Exams: CT ABDOMEN WO CONTRAST  Result Date: 09/04/2021 CLINICAL DATA:  Dysphagia.  Evaluate for gastrostomy  tube placement. EXAM: CT ABDOMEN WITHOUT CONTRAST TECHNIQUE: Multidetector CT imaging of the abdomen was performed following the standard protocol without IV contrast. RADIATION DOSE REDUCTION: This exam was performed according to the departmental dose-optimization program which includes automated exposure control, adjustment of the mA and/or kV according to patient size and/or use of iterative reconstruction technique. COMPARISON:  None. FINDINGS: Lower chest: Right middle lobe lobular nodule containing and internal dystrophic calcification measures 2.2 x 2.0 cm. The presence of the calcification is highly suggestive that this represents a benign hamartoma. Consolidation and volume loss with numerous internal air bronchograms present in the dependent portion of the right lower lobe. Diffuse mild bronchial wall thickening. Linear atelectasis versus scarring in the posterior left lower lobe. The heart is normal in size. Atherosclerotic calcifications visualized along the left anterior descending coronary artery. Hepatobiliary: No focal liver abnormality is seen. No gallstones, gallbladder wall thickening, or biliary dilatation. Pancreas: Unremarkable. No pancreatic ductal dilatation or surrounding inflammatory changes. Spleen: Normal in size without focal abnormality. Adrenals/Urinary Tract: Adrenal glands are unremarkable. Kidneys are normal, without renal calculi, focal lesion, or hydronephrosis. Stomach/Bowel: Significant colonic interposition. A large portion of the transverse colon and splenic flexure lies anterior to the stomach. Additionally, the stomach is a relatively high position in the chest secondary to chronic elevation of the diaphragms. Vascular/Lymphatic: Limited evaluation in the absence of intravenous contrast. Atherosclerotic calcifications noted along the abdominal aorta. No aneurysm. No suspicious lymphadenopathy. Other: Transpyloric feeding tube in place. The tip lies  in the third segment of  the duodenum. Musculoskeletal: No acute fracture or aggressive appearing lytic or blastic osseous lesion. IMPRESSION: 1. 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 Anatomically, patient is not an ideal candidate for percutaneous gastrostomy tube placement. An attempt at placement could be considered but would require administration of oral contrast material to ensure that the interposed colon is visible and that it displaces sufficiently following insufflation. 2. Probable right lower lobe aspiration. 3. Transpyloric feeding tube in place with the tip in the horizontal segment of the duodenum. 4. 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 5. Coronary and aortic atherosclerotic calcifications. Aortic Atherosclerosis (ICD10-I70.0). Electronically Signed   By: Malachy Moan M.D.   On: 09/04/2021 15:07   DG Chest Port 1 View  Result Date: 09/04/2021 CLINICAL DATA:  36 year old female with fever. EXAM: PORTABLE CHEST 1 VIEW COMPARISON:  Portable chest 08/30/2021 and earlier. FINDINGS: Portable AP semi upright view at 0625 hours. Stable tracheostomy and visible enteric feeding tube. Stable lung volumes, with moderately elevated right hemidiaphragm new compared to 2021. Normal cardiac size and mediastinal contours. Allowing for portable technique the lungs are clear. Negative visible bowel gas. Displaced distal right clavicle fracture is stable since 08/28/2021. No other No acute osseous abnormality identified. IMPRESSION: 1. Stable lines and tubes.  No acute cardiopulmonary abnormality. 2. Mildly displaced distal right clavicle fracture is stable since 08/28/2021. Moderate elevation of the right hemidiaphragm might be normal anatomic variation or right phrenic nerve palsy given suggestion of right chest trauma. Electronically Signed   By: Odessa Fleming M.D.   On: 09/04/2021 06:46    Assessment/Plan Active  Problems:   Acute on chronic respiratory failure with hypoxia (HCC)   Tracheostomy status (HCC)   Altered mental status, unspecified   Multiple trauma to chest   Increased tracheal secretions   Acute on chronic respiratory failure with hypoxia plan is going to be to continue with the T-piece patient has very copious secretions requiring frequent suctioning Tracheostomy remains in place Altered mental state no change we will continue to follow along Multiple chest trauma supportive care we will continue with present therapy Retained secretions continue pulmonary toilet   I have personally seen and evaluated the patient, evaluated laboratory and imaging results, formulated the assessment and plan and placed orders. The Patient requires high complexity decision making with multiple systems involvement.  Rounds were done with the Respiratory Therapy Director and Staff therapists and discussed with nursing staff also.  Yevonne Pax, MD [facility] Pulmonary Critical Care Medicine Sleep Medicine

## 2021-09-06 DIAGNOSIS — S299XXS Unspecified injury of thorax, sequela: Secondary | ICD-10-CM | POA: Diagnosis not present

## 2021-09-06 DIAGNOSIS — R4182 Altered mental status, unspecified: Secondary | ICD-10-CM | POA: Diagnosis not present

## 2021-09-06 DIAGNOSIS — J9621 Acute and chronic respiratory failure with hypoxia: Secondary | ICD-10-CM | POA: Diagnosis not present

## 2021-09-06 DIAGNOSIS — J398 Other specified diseases of upper respiratory tract: Secondary | ICD-10-CM | POA: Diagnosis not present

## 2021-09-06 LAB — CULTURE, RESPIRATORY W GRAM STAIN: Culture: NORMAL

## 2021-09-06 NOTE — Progress Notes (Signed)
Pulmonary Critical Care Medicine [facility]   PULMONARY CRITICAL CARE SERVICE  PROGRESS NOTE     JADIN KAGEL  [id number]:[id number]  DOB: February 26, 1986   DOA: 08/28/2021  Referring Physician: Luna Kitchens, MD  HPI: JENNILEE DEMARCO is a 36 y.o. female being followed for ventilator/airway/oxygen weaning Acute on Chronic Respiratory Failure.  Patient is on T collar currently on 28% FiO2 secretions are copious  Medications: Reviewed on Rounds  Physical Exam:  Vitals: Temperature 98.3 pulse 99 respiratory 34 blood pressure is 122/78 saturations 98%  Ventilator Settings on T collar currently 28% FiO2  General: Comfortable at this time Neck: supple Cardiovascular: no malignant arrhythmias Respiratory: No rhonchi very coarse percent Skin: no rash seen on limited exam Musculoskeletal: No gross abnormality Psychiatric:unable to assess Neurologic:no involuntary movements         Lab Data:   Basic Metabolic Panel: Recent Labs  Lab 08/31/21 0212 09/04/21 0656 09/05/21 1153  NA 137 141 136  K 4.4 3.9 4.1  CL 98 99 101  CO2 31 29 29   GLUCOSE 171* 157* 193*  BUN 17 17 17   CREATININE 0.42* 0.53 0.46  CALCIUM 9.0 9.4 8.8*  MG  --  1.9 1.9  PHOS 3.6 3.6 4.7*    ABG: No results for input(s): PHART, PCO2ART, PO2ART, HCO3, O2SAT in the last 168 hours.  Liver Function Tests: Recent Labs  Lab 08/31/21 0212  ALBUMIN 2.5*   No results for input(s): LIPASE, AMYLASE in the last 168 hours. No results for input(s): AMMONIA in the last 168 hours.  CBC: Recent Labs  Lab 08/31/21 0212 09/05/21 1153  WBC 6.6 7.9  HGB 9.8* 9.1*  HCT 32.6* 30.7*  MCV 87.9 88.0  PLT 538* 466*    Cardiac Enzymes: No results for input(s): CKTOTAL, CKMB, CKMBINDEX, TROPONINI in the last 168 hours.  BNP (last 3 results) No results for input(s): BNP in the last 8760 hours.  ProBNP (last 3 results) No results for input(s): PROBNP in the last 8760 hours.  Radiological  Exams: CT ABDOMEN WO CONTRAST  Result Date: 09/04/2021 CLINICAL DATA:  Dysphagia.  Evaluate for gastrostomy tube placement. EXAM: CT ABDOMEN WITHOUT CONTRAST TECHNIQUE: Multidetector CT imaging of the abdomen was performed following the standard protocol without IV contrast. RADIATION DOSE REDUCTION: This exam was performed according to the departmental dose-optimization program which includes automated exposure control, adjustment of the mA and/or kV according to patient size and/or use of iterative reconstruction technique. COMPARISON:  None. FINDINGS: Lower chest: Right middle lobe lobular nodule containing and internal dystrophic calcification measures 2.2 x 2.0 cm. The presence of the calcification is highly suggestive that this represents a benign hamartoma. Consolidation and volume loss with numerous internal air bronchograms present in the dependent portion of the right lower lobe. Diffuse mild bronchial wall thickening. Linear atelectasis versus scarring in the posterior left lower lobe. The heart is normal in size. Atherosclerotic calcifications visualized along the left anterior descending coronary artery. Hepatobiliary: No focal liver abnormality is seen. No gallstones, gallbladder wall thickening, or biliary dilatation. Pancreas: Unremarkable. No pancreatic ductal dilatation or surrounding inflammatory changes. Spleen: Normal in size without focal abnormality. Adrenals/Urinary Tract: Adrenal glands are unremarkable. Kidneys are normal, without renal calculi, focal lesion, or hydronephrosis. Stomach/Bowel: Significant colonic interposition. A large portion of the transverse colon and splenic flexure lies anterior to the stomach. Additionally, the stomach is a relatively high position in the chest secondary to chronic elevation of the diaphragms. Vascular/Lymphatic: Limited evaluation in the absence  of intravenous contrast. Atherosclerotic calcifications noted along the abdominal aorta. No aneurysm. No  suspicious lymphadenopathy. Other: Transpyloric feeding tube in place. The tip lies in the third segment of the duodenum. Musculoskeletal: No acute fracture or aggressive appearing lytic or blastic osseous lesion. IMPRESSION: 1. 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 Anatomically, patient is not an ideal candidate for percutaneous gastrostomy tube placement. An attempt at placement could be considered but would require administration of oral contrast material to ensure that the interposed colon is visible and that it displaces sufficiently following insufflation. 2. Probable right lower lobe aspiration. 3. Transpyloric feeding tube in place with the tip in the horizontal segment of the duodenum. 4. 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 5. Coronary and aortic atherosclerotic calcifications. Aortic Atherosclerosis (ICD10-I70.0). Electronically Signed   By: Malachy Moan M.D.   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 The Patient requires high complexity decision making with multiple systems involvement.  Rounds were done with the Respiratory Therapy Director and Staff therapists and discussed with  nursing staff also.  Yevonne Pax, MD [facility] Pulmonary Critical Care Medicine Sleep Medicine

## 2021-09-07 ENCOUNTER — Other Ambulatory Visit (HOSPITAL_COMMUNITY): Payer: Medicare Other

## 2021-09-07 DIAGNOSIS — J9621 Acute and chronic respiratory failure with hypoxia: Secondary | ICD-10-CM | POA: Diagnosis not present

## 2021-09-07 DIAGNOSIS — S299XXS Unspecified injury of thorax, sequela: Secondary | ICD-10-CM | POA: Diagnosis not present

## 2021-09-07 DIAGNOSIS — J398 Other specified diseases of upper respiratory tract: Secondary | ICD-10-CM | POA: Diagnosis not present

## 2021-09-07 DIAGNOSIS — R4182 Altered mental status, unspecified: Secondary | ICD-10-CM | POA: Diagnosis not present

## 2021-09-07 MED ORDER — DIATRIZOATE MEGLUMINE & SODIUM 66-10 % PO SOLN: 30.0000 mL | Freq: Once | ORAL | Status: DC

## 2021-09-07 MED ORDER — LIDOCAINE VISCOUS HCL 2 % MT SOLN: 6.0000 mL | Freq: Once | OROMUCOSAL | Status: DC

## 2021-09-07 NOTE — Progress Notes (Signed)
Pulmonary Critical Care Medicine [facility]   PULMONARY CRITICAL CARE SERVICE  PROGRESS NOTE     Dana Noble  [id number]  DOB: 1986/05/16   DOA: 08/28/2021  Referring Physician: Luna Kitchens, MD  HPI: Dana Noble is a 36 y.o. female being followed for ventilator/airway/oxygen weaning Acute on Chronic Respiratory Failure.  Patient is on T collar has extreme secretions noted very copious despite being on medications to reduce secretions  Medications: Reviewed on Rounds  Physical Exam:  Vitals: Temperature is 97.1 pulse 92 respiratory is 24 blood pressure is 114/70 saturations 95%  Ventilator Settings off the ventilator on T collar on 28% FiO2 patient is at baseline  General: Comfortable at this time Neck: supple Cardiovascular: no malignant arrhythmias Respiratory: Scattered rhonchi expansion is equal Skin: no rash seen on limited exam Musculoskeletal: No gross abnormality Psychiatric:unable to assess Neurologic:no involuntary movements         Lab Data:   Basic Metabolic Panel: Recent Labs  Lab 09/04/21 0656 09/05/21 1153  NA 141 136  K 3.9 4.1  CL 99 101  CO2 29 29  GLUCOSE 157* 193*  BUN 17 17  CREATININE 0.53 0.46  CALCIUM 9.4 8.8*  MG 1.9 1.9  PHOS 3.6 4.7*    ABG: No results for input(s): PHART, PCO2ART, PO2ART, HCO3, O2SAT in the last 168 hours.  Liver Function Tests: No results for input(s): AST, ALT, ALKPHOS, BILITOT, PROT, ALBUMIN in the last 168 hours. No results for input(s): LIPASE, AMYLASE in the last 168 hours. No results for input(s): AMMONIA in the last 168 hours.  CBC: Recent Labs  Lab 09/05/21 1153  WBC 7.9  HGB 9.1*  HCT 30.7*  MCV 88.0  PLT 466*    Cardiac Enzymes: No results for input(s): CKTOTAL, CKMB, CKMBINDEX, TROPONINI in the last 168 hours.  BNP (last 3 results) No results for input(s): BNP in the last 8760 hours.  ProBNP (last 3 results) No results for input(s): PROBNP in  the last 8760 hours.  Radiological Exams: No results found.  Assessment/Plan Active Problems:   Acute on chronic respiratory failure with hypoxia (HCC)   Tracheostomy status (HCC)   Altered mental status, unspecified   Multiple trauma to chest   Increased tracheal secretions   Acute on chronic respiratory failure with hypoxia we will continue with pulmonary toileting patient is on 28% oxygen saturations are good Tracheostomy remains in place Altered mental status no change we will continue to follow along closely Multiple chest trauma supportive care Increased tracheal secretions continue with pulmonary toilet   I have personally seen and evaluated the patient, evaluated laboratory and imaging results, formulated the assessment and plan and placed orders. The Patient requires high complexity decision making with multiple systems involvement.  Rounds were done with the Respiratory Therapy Director and Staff therapists and discussed with nursing staff also.  Dana Pax, MD [facility] Pulmonary Critical Care Medicine Sleep Medicine

## 2021-09-07 NOTE — Progress Notes (Signed)
Interventional Radiology Brief Note:  Discussed C-collar precautions with Waylan Boga, NP.  She has reached out to neurosurgery at [facility] who have approved the patient's brace to be removed for gastrostomy tube placement.  Will reassess early next week for scheduling placement with contrast to be given the night prior.   Loyce Dys, MS RD PA-C

## 2021-09-08 DIAGNOSIS — R4182 Altered mental status, unspecified: Secondary | ICD-10-CM | POA: Diagnosis not present

## 2021-09-08 DIAGNOSIS — J398 Other specified diseases of upper respiratory tract: Secondary | ICD-10-CM | POA: Diagnosis not present

## 2021-09-08 DIAGNOSIS — S299XXS Unspecified injury of thorax, sequela: Secondary | ICD-10-CM | POA: Diagnosis not present

## 2021-09-08 DIAGNOSIS — J9621 Acute and chronic respiratory failure with hypoxia: Secondary | ICD-10-CM | POA: Diagnosis not present

## 2021-09-08 LAB — BASIC METABOLIC PANEL
Anion gap: 9 (ref 5–15)
BUN: 13 mg/dL (ref 6–20)
CO2: 30 mmol/L (ref 22–32)
Calcium: 9 mg/dL (ref 8.9–10.3)
Chloride: 100 mmol/L (ref 98–111)
Creatinine, Ser: 0.49 mg/dL (ref 0.44–1.00)
GFR, Estimated: >60 mL/min (ref >60)
Glucose, Bld: 147 mg/dL — ABNORMAL HIGH (ref 70–99)
Potassium: 4 mmol/L (ref 3.5–5.1)
Sodium: 139 mmol/L (ref 135–145)

## 2021-09-08 LAB — CBC
HCT: 34.1 % — ABNORMAL LOW (ref 36.0–46.0)
Hemoglobin: 10.6 g/dL — ABNORMAL LOW (ref 12.0–15.0)
MCH: 27 pg (ref 26.0–34.0)
MCHC: 31.1 g/dL (ref 30.0–36.0)
MCV: 86.8 fL (ref 80.0–100.0)
Platelets: 491 10*3/uL — ABNORMAL HIGH (ref 150–400)
RBC: 3.93 MIL/uL (ref 3.87–5.11)
RDW: 16.9 % — ABNORMAL HIGH (ref 11.5–15.5)
WBC: 7.9 10*3/uL (ref 4.0–10.5)
nRBC: 0 % (ref 0.0–0.2)

## 2021-09-08 LAB — PHOSPHORUS: Phosphorus: 4.7 mg/dL — ABNORMAL HIGH (ref 2.5–4.6)

## 2021-09-08 LAB — MAGNESIUM: Magnesium: 1.8 mg/dL (ref 1.7–2.4)

## 2021-09-08 NOTE — Progress Notes (Signed)
Pulmonary Critical Care Medicine [place]  PROGRESS NOTE     Dana Noble  [id number]  DOB: [birth year]   DOA: 08/28/2021  Referring Physician: Satira Sark, MD  HPI: Dana Noble is a 36 y.o. female being followed for ventilator/airway/oxygen weaning Acute on Chronic Respiratory Failure.  Patient is afebrile comfortable without distress has been off mechanical support on the T collar patient has extreme copious secretions despite medical therapy  Medications: Reviewed on Rounds  Physical Exam:  Vitals: Temperature is 97.8 pulse 98 respiratory rate is 20 blood pressure 117/65 saturations 100%  Ventilator Settings on T collar copious secretions  General: Comfortable at this time Neck: supple Cardiovascular: no malignant arrhythmias Respiratory: Very coarse breath sounds are noted bilaterally Skin: no rash seen on limited exam Musculoskeletal: No gross abnormality Psychiatric:unable to assess Neurologic:no involuntary movements         Lab Data:   Basic Metabolic Panel: Recent Labs  Lab 09/04/21 0656 09/05/21 1153 09/08/21 0704  NA 141 136 139  K 3.9 4.1 4.0  CL 99 101 100  CO2 29 29 30   GLUCOSE 157* 193* 147*  BUN 17 17 13   CREATININE 0.53 0.46 0.49  CALCIUM 9.4 8.8* 9.0  MG 1.9 1.9 1.8  PHOS 3.6 4.7* 4.7*    ABG: No results for input(s): PHART, PCO2ART, PO2ART, HCO3, O2SAT in the last 168 hours.  Liver Function Tests: No results for input(s): AST, ALT, ALKPHOS, BILITOT, PROT, ALBUMIN in the last 168 hours. No results for input(s): LIPASE, AMYLASE in the last 168 hours. No results for input(s): AMMONIA in the last 168 hours.  CBC: Recent Labs  Lab 09/05/21 1153 09/08/21 0704  WBC 7.9 7.9  HGB 9.1* 10.6*  HCT 30.7* 34.1*  MCV 88.0 86.8  PLT 466* 491*    Cardiac Enzymes: No results for input(s): CKTOTAL, CKMB, CKMBINDEX, TROPONINI in the last 168 hours.  BNP (last 3  results) No results for input(s): BNP in the last 8760 hours.  ProBNP (last 3 results) No results for input(s): PROBNP in the last 8760 hours.  Radiological Exams: DG Abd 1 View  Result Date: 09/07/2021 CLINICAL DATA:  Enteric tube placement. EXAM: ABDOMEN - 1 VIEW COMPARISON:  Abdominal x-ray dated August 28, 2021. FLUOROSCOPY TIME:  Radiation Exposure Index (as provided by the fluoroscopic device): 6.0 mGy Fluoroscopy Time:  42 seconds Number of Acquired Images:  1 FINDINGS: Enteric tube looped within the stomach. Visualized bowel gas pattern is normal. IMPRESSION: 1. Enteric tube in the stomach. Electronically Signed   By: Titus Dubin M.D.   On: 09/07/2021 14:58    Assessment/Plan Active Problems:   Acute on chronic respiratory failure with hypoxia (HCC)   Tracheostomy status (HCC)   Altered mental status, unspecified   Multiple trauma to chest   Increased tracheal secretions   Acute on chronic respiratory failure with hypoxia plan is to continue with the T-piece patient has been refusing suctioning by respiratory therapist according to therapist during rounds.  Patient will be encouraged to be compliant with recommended therapy Tracheostomy will remain in place Altered mental status no change we will continue to follow along closely Multiple chest trauma supportive care Increased tracheal secretions we will continue pulmonary toilet and supportive care   I have personally seen and evaluated the patient, evaluated laboratory and imaging results, formulated the assessment and plan and placed orders. The Patient requires high complexity decision making with multiple systems involvement.  Rounds  were done with the Respiratory Therapy Director and Staff therapists and discussed with nursing staff also.  Allyne Gee, MD [facility] Pulmonary Critical Care Medicine Sleep Medicine

## 2021-09-09 LAB — CULTURE, BLOOD (ROUTINE X 2)
Culture: NO GROWTH
Culture: NO GROWTH
Special Requests: ADEQUATE

## 2021-09-10 DIAGNOSIS — R4182 Altered mental status, unspecified: Secondary | ICD-10-CM | POA: Diagnosis not present

## 2021-09-10 DIAGNOSIS — J9621 Acute and chronic respiratory failure with hypoxia: Secondary | ICD-10-CM | POA: Diagnosis not present

## 2021-09-10 DIAGNOSIS — Z930 Tracheostomy status: Secondary | ICD-10-CM | POA: Diagnosis not present

## 2021-09-10 DIAGNOSIS — J398 Other specified diseases of upper respiratory tract: Secondary | ICD-10-CM | POA: Diagnosis not present

## 2021-09-10 MED ORDER — IOHEXOL 300 MG/ML  SOLN: 100.0000 mL | Freq: Once | INTRAMUSCULAR | Status: DC

## 2021-09-10 MED ORDER — IOHEXOL 300 MG/ML  SOLN
75.0000 mL | Freq: Once | INTRAMUSCULAR | Status: DC
Start: 2021-09-13 — End: 2021-10-29

## 2021-09-10 MED ORDER — CEFAZOLIN SODIUM-DEXTROSE 2-4 GM/100ML-% IV SOLN: 2.0000 g | INTRAVENOUS | Status: AC

## 2021-09-10 MED ORDER — IOHEXOL 300 MG/ML  SOLN: 75.0000 mL | Freq: Once | INTRAMUSCULAR | Status: DC | PRN

## 2021-09-10 NOTE — Progress Notes (Addendum)
G tube placement tentatively scheduled for this Thursday at the earliest due to pt being on ASA 325 mg, which was given this morning confirmed with RN.   PLAN: - Hold ASA till G tube placement is done, starting tomorrow - RN informed. - Hold Lovenox starting Wed - RN informed.  - Stop TF at Thursday [facility] - Give 75 cc Omni 300 via NG tube the day before the G tube placement, will be brought to [facility] by radiology.   Please call IR for questions and concerns.   Dana Noble H Vicki Chaffin PA-C 09/10/2021 11:00 AM

## 2021-09-10 NOTE — Progress Notes (Signed)
Pulmonary Critical Care Medicine [facility]   PULMONARY CRITICAL CARE SERVICE  PROGRESS NOTE     IA LEEB  VQQ:[id number]  DOB: November 02, 1985   DOA: 08/28/2021  Referring Physician: Luna Kitchens, MD  HPI: Dana Noble is a 36 y.o. female being followed for ventilator/airway/oxygen weaning Acute on Chronic Respiratory Failure.  Patient is afebrile without distress remains with very copious secretions currently on 28% FiO2  Medications: Reviewed on Rounds  Physical Exam:  Vitals: Temperature is 97.3 pulse 96 respiratory 18 blood pressure is 145/60 saturations 95%  Ventilator Settings off the ventilator on T collar  General: Comfortable at this time Neck: supple Cardiovascular: no malignant arrhythmias Respiratory: Scattered rhonchi expansion is equal Skin: no rash seen on limited exam Musculoskeletal: No gross abnormality Psychiatric:unable to assess Neurologic:no involuntary movements         Lab Data:   Basic Metabolic Panel: Recent Labs  Lab 09/04/21 0656 09/05/21 1153 09/08/21 0704  NA 141 136 139  K 3.9 4.1 4.0  CL 99 101 100  CO2 29 29 30   GLUCOSE 157* 193* 147*  BUN 17 17 13   CREATININE 0.53 0.46 0.49  CALCIUM 9.4 8.8* 9.0  MG 1.9 1.9 1.8  PHOS 3.6 4.7* 4.7*    ABG: No results for input(s): PHART, PCO2ART, PO2ART, HCO3, O2SAT in the last 168 hours.  Liver Function Tests: No results for input(s): AST, ALT, ALKPHOS, BILITOT, PROT, ALBUMIN in the last 168 hours. No results for input(s): LIPASE, AMYLASE in the last 168 hours. No results for input(s): AMMONIA in the last 168 hours.  CBC: Recent Labs  Lab 09/05/21 1153 09/08/21 0704  WBC 7.9 7.9  HGB 9.1* 10.6*  HCT 30.7* 34.1*  MCV 88.0 86.8  PLT 466* 491*    Cardiac Enzymes: No results for input(s): CKTOTAL, CKMB, CKMBINDEX, TROPONINI in the last 168 hours.  BNP (last 3 results) No results for input(s): BNP in the last 8760 hours.  ProBNP (last 3  results) No results for input(s): PROBNP in the last 8760 hours.  Radiological Exams: No results found.  Assessment/Plan Active Problems:   Acute on chronic respiratory failure with hypoxia (HCC)   Tracheostomy status (HCC)   Altered mental status, unspecified   Multiple trauma to chest   Increased tracheal secretions   Acute on chronic respiratory failure hypoxia we will continue with T-piece patient has copious secretions requiring frequent suctioning this is limiting for being able to decannulate Tracheostomy remains in place we will continue to monitor Altered mental status no change Multiple chest trauma supportive care prognosis guarded Increased secretions continue with aggressive pulmonary toilet   I have personally seen and evaluated the patient, evaluated laboratory and imaging results, formulated the assessment and plan and placed orders. The Patient requires high complexity decision making with multiple systems involvement.  Rounds were done with the Respiratory Therapy Director and Staff therapists and discussed with nursing staff also.  09/07/21, MD [facility] Pulmonary Critical Care Medicine Sleep Medicine

## 2021-09-11 DIAGNOSIS — J398 Other specified diseases of upper respiratory tract: Secondary | ICD-10-CM | POA: Diagnosis not present

## 2021-09-11 DIAGNOSIS — R4182 Altered mental status, unspecified: Secondary | ICD-10-CM | POA: Diagnosis not present

## 2021-09-11 DIAGNOSIS — S299XXS Unspecified injury of thorax, sequela: Secondary | ICD-10-CM | POA: Diagnosis not present

## 2021-09-11 DIAGNOSIS — J9621 Acute and chronic respiratory failure with hypoxia: Secondary | ICD-10-CM | POA: Diagnosis not present

## 2021-09-11 NOTE — Progress Notes (Signed)
Pulmonary Critical Care Medicine [facility]   PULMONARY CRITICAL CARE SERVICE  PROGRESS NOTE     Dana Noble  [id number]  DOB: [birth year]   DOA: 08/28/2021  Referring Physician: Luna Kitchens, MD  HPI: Dana Noble is a 36 y.o. female being followed for ventilator/airway/oxygen weaning Acute on Chronic Respiratory Failure.  Patient is on T-piece secretions remain copious.  No major changes no improvement is noted  Medications: Reviewed on Rounds  Physical Exam:  Vitals: Temperature is 97.7 pulse 100 respiratory 29 blood pressure is 123/69 saturations 100%  Ventilator Settings continue with the T-piece.  Titrate oxygen as tolerated continue pulmonary toilet.  General: Comfortable at this time Neck: supple Cardiovascular: no malignant arrhythmias Respiratory: No rhonchi or rales at this time. Skin: no rash seen on limited exam Musculoskeletal: No gross abnormality Psychiatric:unable to assess Neurologic:no involuntary movements         Lab Data:   Basic Metabolic Panel: Recent Labs  Lab 09/05/21 1153 09/08/21 0704  NA 136 139  K 4.1 4.0  CL 101 100  CO2 29 30  GLUCOSE 193* 147*  BUN 17 13  CREATININE 0.46 0.49  CALCIUM 8.8* 9.0  MG 1.9 1.8  PHOS 4.7* 4.7*    ABG: No results for input(s): PHART, PCO2ART, PO2ART, HCO3, O2SAT in the last 168 hours.  Liver Function Tests: No results for input(s): AST, ALT, ALKPHOS, BILITOT, PROT, ALBUMIN in the last 168 hours. No results for input(s): LIPASE, AMYLASE in the last 168 hours. No results for input(s): AMMONIA in the last 168 hours.  CBC: Recent Labs  Lab 09/05/21 1153 09/08/21 0704  WBC 7.9 7.9  HGB 9.1* 10.6*  HCT 30.7* 34.1*  MCV 88.0 86.8  PLT 466* 491*    Cardiac Enzymes: No results for input(s): CKTOTAL, CKMB, CKMBINDEX, TROPONINI in the last 168 hours.  BNP (last 3 results) No results for input(s): BNP in the last 8760 hours.  ProBNP (last 3 results) No  results for input(s): PROBNP in the last 8760 hours.  Radiological Exams: No results found.  Assessment/Plan Active Problems:   Acute on chronic respiratory failure with hypoxia (HCC)   Tracheostomy status (HCC)   Altered mental status, unspecified   Multiple trauma to chest   Increased tracheal secretions   Acute on chronic respiratory failure with hypoxia at this time patient is on T collar she is at her baseline very copious secretions limiting [place] as far as being able to advance weaning any further Tracheostomy remains in place we will continue to monitor along closely. Altered mental status at baseline Multiple chest trauma supportive care Increased tracheal secretions patient is at baseline   I have personally seen and evaluated the patient, evaluated laboratory and imaging results, formulated the assessment and plan and placed orders. The Patient requires high complexity decision making with multiple systems involvement.  Rounds were done with the Respiratory Therapy Director and Staff therapists and discussed with nursing staff also.  Yevonne Pax, MD [facility] Pulmonary Critical Care Medicine Sleep Medicine

## 2021-09-12 ENCOUNTER — Other Ambulatory Visit (HOSPITAL_COMMUNITY): Payer: Medicare Other

## 2021-09-12 DIAGNOSIS — Z930 Tracheostomy status: Secondary | ICD-10-CM | POA: Diagnosis not present

## 2021-09-12 DIAGNOSIS — J9621 Acute and chronic respiratory failure with hypoxia: Secondary | ICD-10-CM | POA: Diagnosis not present

## 2021-09-12 DIAGNOSIS — J398 Other specified diseases of upper respiratory tract: Secondary | ICD-10-CM | POA: Diagnosis not present

## 2021-09-12 DIAGNOSIS — R4182 Altered mental status, unspecified: Secondary | ICD-10-CM | POA: Diagnosis not present

## 2021-09-12 LAB — RENAL FUNCTION PANEL
Albumin: 2.7 g/dL — ABNORMAL LOW (ref 3.5–5.0)
Anion gap: 7 (ref 5–15)
BUN: 15 mg/dL (ref 6–20)
CO2: 32 mmol/L (ref 22–32)
Calcium: 9.3 mg/dL (ref 8.9–10.3)
Chloride: 99 mmol/L (ref 98–111)
Creatinine, Ser: 0.59 mg/dL (ref 0.44–1.00)
GFR, Estimated: >60 mL/min (ref >60)
Glucose, Bld: 145 mg/dL — ABNORMAL HIGH (ref 70–99)
Phosphorus: 4.5 mg/dL (ref 2.5–4.6)
Potassium: 4.1 mmol/L (ref 3.5–5.1)
Sodium: 138 mmol/L (ref 135–145)

## 2021-09-12 LAB — CBC
HCT: 37.2 % (ref 36.0–46.0)
Hemoglobin: 11.5 g/dL — ABNORMAL LOW (ref 12.0–15.0)
MCH: 27 pg (ref 26.0–34.0)
MCHC: 30.9 g/dL (ref 30.0–36.0)
MCV: 87.3 fL (ref 80.0–100.0)
Platelets: 499 10*3/uL — ABNORMAL HIGH (ref 150–400)
RBC: 4.26 MIL/uL (ref 3.87–5.11)
RDW: 16.5 % — ABNORMAL HIGH (ref 11.5–15.5)
WBC: 8 10*3/uL (ref 4.0–10.5)
nRBC: 0 % (ref 0.0–0.2)

## 2021-09-12 LAB — MAGNESIUM: Magnesium: 1.9 mg/dL (ref 1.7–2.4)

## 2021-09-12 NOTE — Progress Notes (Addendum)
Omnipaque provided to primary RN to be given this evening in preparation for G-tube insertion tomorrow.  RN advised that pt is to have tube feeds d/c at [facility] tonight and remain NPO/nothing per tube.  Advised that orders have been placed and pt will also receive Ancef prior to procedure.    Alex Gardener, AGNP-BC 09/12/2021, 3:30 PM

## 2021-09-12 NOTE — Progress Notes (Signed)
Pulmonary Critical Care Medicine [place]  PROGRESS NOTE     Dana Noble  [id number]  DOB: [birth year]   DOA: 08/28/2021  Referring Physician: Satira Sark, MD  HPI: Dana Noble is a 36 y.o. female being followed for ventilator/airway/oxygen weaning Acute on Chronic Respiratory Failure.  On T collar on 28% FiO2 copious secretions are noted  Medications: Reviewed on Rounds  Physical Exam:  Vitals: Temperature 97.8 pulse 98 respiratory rate is 21 blood pressure is 132/67 saturations 98%  Ventilator Settings on T collar FiO2 is 28%  General: Comfortable at this time Neck: supple Cardiovascular: no malignant arrhythmias Respiratory: No rhonchi very coarse breath sounds Skin: no rash seen on limited exam Musculoskeletal: No gross abnormality Psychiatric:unable to assess Neurologic:no involuntary movements         Lab Data:   Basic Metabolic Panel: Recent Labs  Lab 09/05/21 1153 09/08/21 0704 09/12/21 0404  NA 136 139 138  K 4.1 4.0 4.1  CL 101 100 99  CO2 29 30 32  GLUCOSE 193* 147* 145*  BUN 17 13 15   CREATININE 0.46 0.49 0.59  CALCIUM 8.8* 9.0 9.3  MG 1.9 1.8 1.9  PHOS 4.7* 4.7* 4.5    ABG: No results for input(s): PHART, PCO2ART, PO2ART, HCO3, O2SAT in the last 168 hours.  Liver Function Tests: Recent Labs  Lab 09/12/21 0404  ALBUMIN 2.7*   No results for input(s): LIPASE, AMYLASE in the last 168 hours. No results for input(s): AMMONIA in the last 168 hours.  CBC: Recent Labs  Lab 09/05/21 1153 09/08/21 0704 09/12/21 0404  WBC 7.9 7.9 8.0  HGB 9.1* 10.6* 11.5*  HCT 30.7* 34.1* 37.2  MCV 88.0 86.8 87.3  PLT 466* 491* 499*    Cardiac Enzymes: No results for input(s): CKTOTAL, CKMB, CKMBINDEX, TROPONINI in the last 168 hours.  BNP (last 3 results) No results for input(s): BNP in the last 8760 hours.  ProBNP (last 3 results) No results for input(s): PROBNP in the  last 8760 hours.  Radiological Exams: No results found.  Assessment/Plan Active Problems:   Acute on chronic respiratory failure with hypoxia (HCC)   Tracheostomy status (HCC)   Altered mental status, unspecified   Multiple trauma to chest   Increased tracheal secretions   Acute on chronic respiratory failure with hypoxia we will continue with the T collar weaning patient's Copious secretions which may very well have sinus issues also recommended getting a sinus CT for further evaluation Tracheostomy will need to remain in place for now Multiple chest trauma no change supportive care Retained secretions continue with aggressive pulmonary toilet Altered mental state she remains somewhat confused   I have personally seen and evaluated the patient, evaluated laboratory and imaging results, formulated the assessment and plan and placed orders. The Patient requires high complexity decision making with multiple systems involvement.  Rounds were done with the Respiratory Therapy Director and Staff therapists and discussed with nursing staff also.  Allyne Gee, MD [facility] Pulmonary Critical Care Medicine Sleep Medicine

## 2021-09-13 ENCOUNTER — Other Ambulatory Visit (HOSPITAL_COMMUNITY): Payer: Medicare Other

## 2021-09-13 DIAGNOSIS — R4182 Altered mental status, unspecified: Secondary | ICD-10-CM | POA: Diagnosis not present

## 2021-09-13 DIAGNOSIS — J9621 Acute and chronic respiratory failure with hypoxia: Secondary | ICD-10-CM | POA: Diagnosis not present

## 2021-09-13 DIAGNOSIS — J398 Other specified diseases of upper respiratory tract: Secondary | ICD-10-CM | POA: Diagnosis not present

## 2021-09-13 DIAGNOSIS — S299XXS Unspecified injury of thorax, sequela: Secondary | ICD-10-CM | POA: Diagnosis not present

## 2021-09-13 HISTORY — PX: IR GASTROSTOMY TUBE MOD SED: IMG625

## 2021-09-13 MED ORDER — MIDAZOLAM HCL 2 MG/2ML IJ SOLN
INTRAMUSCULAR | Status: AC | PRN
Start: 2021-09-13 — End: 2021-09-13
  Administered 2021-09-13 (×2): .5 mg via INTRAVENOUS

## 2021-09-13 MED ORDER — FENTANYL CITRATE (PF) 100 MCG/2ML IJ SOLN
INTRAMUSCULAR | Status: AC | PRN
  Administered 2021-09-13: 25 ug via INTRAVENOUS

## 2021-09-13 MED ORDER — FENTANYL CITRATE (PF) 100 MCG/2ML IJ SOLN
INTRAMUSCULAR | Status: AC
  Filled 2021-09-13: qty 2

## 2021-09-13 MED ORDER — MIDAZOLAM HCL 2 MG/2ML IJ SOLN
INTRAMUSCULAR | Status: AC
  Filled 2021-09-13: qty 2

## 2021-09-13 MED ORDER — CEFAZOLIN SODIUM-DEXTROSE 2-4 GM/100ML-% IV SOLN
INTRAVENOUS | Status: AC
  Filled 2021-09-13: qty 100

## 2021-09-13 MED ORDER — IOHEXOL 300 MG/ML  SOLN
100.0000 mL | Freq: Once | INTRAMUSCULAR | Status: AC | PRN
  Administered 2021-09-13: 10 mL

## 2021-09-13 MED ORDER — LIDOCAINE HCL 1 % IJ SOLN
INTRAMUSCULAR | Status: AC
  Filled 2021-09-13: qty 20

## 2021-09-13 NOTE — Procedures (Signed)
Interventional Radiology Procedure Note  Procedure: Percutaneous gastrostomy tube placement  Complications: None  Estimated Blood Loss: < 10 mL  Findings: 20 Fr bumper retention gastrostomy tube placed with tip in body of stomach. OK to use tomorrow.  Jodi Marble. Fredia Sorrow, M.D Pager:  [phone]

## 2021-09-13 NOTE — Progress Notes (Signed)
Pulmonary Critical Care Medicine [facility]   PULMONARY CRITICAL CARE SERVICE  PROGRESS NOTE     Dana Noble  DTO:[id number]  DOB: 08/30/1985   DOA: 08/28/2021  Referring Physician: Luna Kitchens, MD  HPI: Dana Noble is a 36 y.o. female being followed for ventilator/airway/oxygen weaning Acute on Chronic Respiratory Failure.  Patient is on T collar has been on 28% FiO2 saturations are good CT sinuses was done showing infection  Medications: Reviewed on Rounds  Physical Exam:  Vitals: The temperature is 97.8 pulse 101 respiratory rate is 20 blood pressure is 129/78 saturations 100%  Ventilator Settings on T-piece FiO2 28%  General: Comfortable at this time Neck: supple Cardiovascular: no malignant arrhythmias Respiratory: No rhonchi very coarse percent Skin: no rash seen on limited exam Musculoskeletal: No gross abnormality Psychiatric:unable to assess Neurologic:no involuntary movements         Lab Data:   Basic Metabolic Panel: Recent Labs  Lab 09/08/21 0704 09/12/21 0404  NA 139 138  K 4.0 4.1  CL 100 99  CO2 30 32  GLUCOSE 147* 145*  BUN 13 15  CREATININE 0.49 0.59  CALCIUM 9.0 9.3  MG 1.8 1.9  PHOS 4.7* 4.5    ABG: No results for input(s): PHART, PCO2ART, PO2ART, HCO3, O2SAT in the last 168 hours.  Liver Function Tests: Recent Labs  Lab 09/12/21 0404  ALBUMIN 2.7*   No results for input(s): LIPASE, AMYLASE in the last 168 hours. No results for input(s): AMMONIA in the last 168 hours.  CBC: Recent Labs  Lab 09/08/21 0704 09/12/21 0404  WBC 7.9 8.0  HGB 10.6* 11.5*  HCT 34.1* 37.2  MCV 86.8 87.3  PLT 491* 499*    Cardiac Enzymes: No results for input(s): CKTOTAL, CKMB, CKMBINDEX, TROPONINI in the last 168 hours.  BNP (last 3 results) No results for input(s): BNP in the last 8760 hours.  ProBNP (last 3 results) No results for input(s): PROBNP in the last 8760 hours.  Radiological Exams: CT  MAXILLOFACIAL WO CONTRAST  Result Date: 09/12/2021 CLINICAL DATA:  Copious amount of secretions. Looking for possible sinus source. EXAM: CT MAXILLOFACIAL WITHOUT CONTRAST TECHNIQUE: Multidetector CT imaging of the maxillofacial structures was performed. Multiplanar CT image reconstructions were also generated. RADIATION DOSE REDUCTION: This exam was performed according to the departmental dose-optimization program which includes automated exposure control, adjustment of the mA and/or kV according to patient size and/or use of iterative reconstruction technique. COMPARISON:  None. FINDINGS: Osseous: No acute fracture is seen. Orbits: The orbital globes are intact. The extraocular muscles are intact. Sinuses: There is opacification of the left 50% of the frontal sinuses. There is moderate left mild-to-moderate right ethmoid air cell mucosal opacification. There is complete opacification of the left sphenoid sinus with moderate opacification of the smaller right sphenoid sinus. The left ostiomeatal unit is patent. The inferior ostium of the right ostiomeatal unit is opacified (coronal series 5, image 24). The maxillary sinuses are clear. No paranasal sinus air-fluid levels.  No osseous erosion. Moderate bilateral mastoid air cell opacification. Soft tissues: A nasogastric tube is seen extending through the left nares into the pharynx and off of the inferior plantar view. Partial visualization of endotracheal tube. The thyroid gland is partially visualized. There appears to be low-density within the central left thyroid gland with mild-to-moderate left thyroid lobe enlargement, not well evaluated on the current study. There are mild mucosal secretions seen within the pharynx just anterior to the epiglottis and within the right  piriform sinus. Limited intracranial: No significant or unexpected finding. IMPRESSION:: IMPRESSION: 1. There is moderate frontal, ethmoid, and sphenoid sinus paranasal sinus mucosal  opacification. Moderate mucosal opacification of the ethmoid air cells. There is opacification of the inferior ostium of the right ostiomeatal unit. The left ostiomeatal unit is patent. 2. Endotracheal tube and nasogastric tube are noted. Electronically Signed   By: Neita Garnet M.D.   On: 09/12/2021 11:13    Assessment/Plan Active Problems:   Acute on chronic respiratory failure with hypoxia (HCC)   Tracheostomy status (HCC)   Altered mental status, unspecified   Multiple trauma to chest   Increased tracheal secretions   Acute on chronic respiratory failure with hypoxia we will continue with the T collar has been on 28% FiO2 saturations are good Tracheostomy remains in place Altered mental status no change we will continue to follow along Multiple trauma to the chest supportive care Retained tracheal secretions at baseline   I have personally seen and evaluated the patient, evaluated laboratory and imaging results, formulated the assessment and plan and placed orders. The Patient requires high complexity decision making with multiple systems involvement.  Rounds were done with the Respiratory Therapy Director and Staff therapists and discussed with nursing staff also.  Yevonne Pax, MD [facility] Pulmonary Critical Care Medicine Sleep Medicine

## 2021-09-14 DIAGNOSIS — J9621 Acute and chronic respiratory failure with hypoxia: Secondary | ICD-10-CM | POA: Diagnosis not present

## 2021-09-14 DIAGNOSIS — Z930 Tracheostomy status: Secondary | ICD-10-CM | POA: Diagnosis not present

## 2021-09-14 DIAGNOSIS — R4182 Altered mental status, unspecified: Secondary | ICD-10-CM | POA: Diagnosis not present

## 2021-09-14 DIAGNOSIS — J398 Other specified diseases of upper respiratory tract: Secondary | ICD-10-CM | POA: Diagnosis not present

## 2021-09-14 NOTE — Progress Notes (Signed)
Pulmonary Critical Care Medicine [place]  PROGRESS NOTE     Dana Noble  NOM:[id number]  DOB: 1986-06-29   DOA: 08/28/2021  Referring Physician: Satira Sark, MD  HPI: Dana Noble is a 36 y.o. female being followed for ventilator/airway/oxygen weaning Acute on Chronic Respiratory Failure.  Patient is on T-piece has been on 28% FiO2 saturations are good secretions are slightly better  Medications: Reviewed on Rounds  Physical Exam:  Vitals: Temperature 97.0 pulse 92 respiratory 16 blood pressure is 144/86 saturations 98%  Ventilator Settings on T collar off the ventilator  General: Comfortable at this time Neck: supple Cardiovascular: no malignant arrhythmias Respiratory: Scattered rhonchi expansion is equal at this time. Skin: no rash seen on limited exam Musculoskeletal: No gross abnormality Psychiatric:unable to assess Neurologic:no involuntary movements         Lab Data:   Basic Metabolic Panel: Recent Labs  Lab 09/08/21 0704 09/12/21 0404  NA 139 138  K 4.0 4.1  CL 100 99  CO2 30 32  GLUCOSE 147* 145*  BUN 13 15  CREATININE 0.49 0.59  CALCIUM 9.0 9.3  MG 1.8 1.9  PHOS 4.7* 4.5    ABG: No results for input(s): PHART, PCO2ART, PO2ART, HCO3, O2SAT in the last 168 hours.  Liver Function Tests: Recent Labs  Lab 09/12/21 0404  ALBUMIN 2.7*   No results for input(s): LIPASE, AMYLASE in the last 168 hours. No results for input(s): AMMONIA in the last 168 hours.  CBC: Recent Labs  Lab 09/08/21 0704 09/12/21 0404  WBC 7.9 8.0  HGB 10.6* 11.5*  HCT 34.1* 37.2  MCV 86.8 87.3  PLT 491* 499*    Cardiac Enzymes: No results for input(s): CKTOTAL, CKMB, CKMBINDEX, TROPONINI in the last 168 hours.  BNP (last 3 results) No results for input(s): BNP in the last 8760 hours.  ProBNP (last 3 results) No results for input(s): PROBNP in the last 8760 hours.  Radiological Exams: IR  GASTROSTOMY TUBE MOD SED  Result Date: 09/13/2021 CLINICAL DATA:  Respiratory failure after prior trauma. Need for percutaneous gastrostomy tube for long-term nutrition. EXAM: PERCUTANEOUS GASTROSTOMY TUBE PLACEMENT ANESTHESIA/SEDATION: Moderate (conscious) sedation was employed during this procedure. A total of Versed 1.0 mg and Fentanyl 25 mcg was administered intravenously by radiology nursing. Moderate Sedation Time: 33 minutes. The patient's level of consciousness and vital signs were monitored continuously by radiology nursing throughout the procedure under my direct supervision. CONTRAST:  69m OMNIPAQUE IOHEXOL 300 MG/ML SOLN MEDICATIONS: 2 g IV Ancef. IV antibiotic was administered in an appropriate time interval prior to needle puncture of the skin. FLUOROSCOPY TIME:  6 minutes and 30 seconds.  111 mGy. PROCEDURE: The procedure, risks, benefits, and alternatives were explained to the patient's aunt. Questions regarding the procedure were encouraged and answered. The patient aunt understands and consents to the procedure. The evening prior to the procedure, the patient was given water-soluble contrast in order to opacify the colon. A 5-French catheter was then advanced through the patient's mouth under fluoroscopy into the esophagus and to the level of the stomach. This catheter was used to insufflate the stomach with air under fluoroscopy. The abdominal wall was prepped with Betadine in a sterile fashion, and a sterile drape was applied covering the operative field. A sterile gown and sterile gloves were used for the procedure. Local anesthesia was provided with 1% Lidocaine. A skin incision was made in the upper abdominal wall. Under fluoroscopy, an 112  gauge trocar needle was advanced into the stomach. Contrast injection was performed to confirm intraluminal position of the needle tip. A single T tack was then deployed in the lumen of the stomach. This was brought up to tension at the skin surface. Over  a guidewire, a 9-French sheath was advanced into the lumen of the stomach. The wire was left in place as a safety wire. A loop snare device from a percutaneous gastrostomy kit was then advanced into the stomach. A floppy guide wire was advanced through the orogastric catheter under fluoroscopy in the stomach. The loop snare advanced through the percutaneous gastric access was used to snare the guide wire. This allowed withdrawal of the loop snare out of the patient's mouth by retraction of the orogastric catheter and wire. A 20-French bumper retention gastrostomy tube was looped around the snare device. It was then pulled back through the patient's mouth. The retention bumper was brought up to the anterior gastric wall. The T tack suture was cut at the skin. The exiting gastrostomy tube was cut to appropriate length and a feeding adapter applied. The catheter was injected with contrast material to confirm position and a fluoroscopic spot image saved. The tube was then flushed with saline. A dressing was applied over the gastrostomy exit site. COMPLICATIONS: None. FINDINGS: Initial fluoroscopy demonstrates partial opacification of the colon by ingested contrast in order to minimize risk of colonic injury during the procedure. The stomach distended well with air allowing safe placement of the gastrostomy tube. After placement, the tip of the gastrostomy tube lies in the body of the stomach. IMPRESSION: Percutaneous gastrostomy with placement of a 20-French bumper retention tube in the body of the stomach. This tube can be used for percutaneous feeds beginning in 24 hours after placement. Electronically Signed   By: Aletta Edouard M.D.   On: 09/13/2021 15:39    Assessment/Plan Active Problems:   Acute on chronic respiratory failure with hypoxia (HCC)   Tracheostomy status (HCC)   Altered mental status, unspecified   Multiple trauma to chest   Increased tracheal secretions   Acute on chronic respiratory  failure with hypoxia patient is on T collar secretions are still copious.  Basically patient has been sinus infection had a PEG tube placed hopefully with removal of the nasogastric tube this will help also needs to be on antibiotics Tracheostomy will remain in place for now Altered mental state no change we will continue to monitor Multiple chest trauma supportive care Retained secretions likely multifactorial including sinus infection issues   I have personally seen and evaluated the patient, evaluated laboratory and imaging results, formulated the assessment and plan and placed orders. The Patient requires high complexity decision making with multiple systems involvement.  Rounds were done with the Respiratory Therapy Director and Staff therapists and discussed with nursing staff also.  Allyne Gee, MD [facility] Pulmonary Critical Care Medicine Sleep Medicine

## 2021-09-15 DIAGNOSIS — R4182 Altered mental status, unspecified: Secondary | ICD-10-CM | POA: Diagnosis not present

## 2021-09-15 DIAGNOSIS — J398 Other specified diseases of upper respiratory tract: Secondary | ICD-10-CM | POA: Diagnosis not present

## 2021-09-15 DIAGNOSIS — Z930 Tracheostomy status: Secondary | ICD-10-CM | POA: Diagnosis not present

## 2021-09-15 DIAGNOSIS — J9621 Acute and chronic respiratory failure with hypoxia: Secondary | ICD-10-CM | POA: Diagnosis not present

## 2021-09-15 LAB — CULTURE, RESPIRATORY W GRAM STAIN: Special Requests: NORMAL

## 2021-09-15 NOTE — Progress Notes (Signed)
Pulmonary Critical Care Medicine [place]  PROGRESS NOTE     Dana Noble  VFI:[id number]  DOB: Jun 13, 1986   DOA: 08/28/2021  Referring Physician: Satira Sark, MD  HPI: Dana Noble is a 36 y.o. female being followed for ventilator/airway/oxygen weaning Acute on Chronic Respiratory Failure.  Patient is off the ventilator now on T collar on 28% FiO2 saturations are good  Medications: Reviewed on Rounds  Physical Exam:  Vitals: Temperature is 97.1 pulse 89 respiratory 22 blood pressure is 110/76 saturations 99%  Ventilator Settings on T collar FiO2 is 28%  General: Comfortable at this time Neck: supple Cardiovascular: no malignant arrhythmias Respiratory: No rhonchi no rales are noted at this time Skin: no rash seen on limited exam Musculoskeletal: No gross abnormality Psychiatric:unable to assess Neurologic:no involuntary movements         Lab Data:   Basic Metabolic Panel: Recent Labs  Lab 09/12/21 0404  NA 138  K 4.1  CL 99  CO2 32  GLUCOSE 145*  BUN 15  CREATININE 0.59  CALCIUM 9.3  MG 1.9  PHOS 4.5    ABG: No results for input(s): PHART, PCO2ART, PO2ART, HCO3, O2SAT in the last 168 hours.  Liver Function Tests: Recent Labs  Lab 09/12/21 0404  ALBUMIN 2.7*   No results for input(s): LIPASE, AMYLASE in the last 168 hours. No results for input(s): AMMONIA in the last 168 hours.  CBC: Recent Labs  Lab 09/12/21 0404  WBC 8.0  HGB 11.5*  HCT 37.2  MCV 87.3  PLT 499*    Cardiac Enzymes: No results for input(s): CKTOTAL, CKMB, CKMBINDEX, TROPONINI in the last 168 hours.  BNP (last 3 results) No results for input(s): BNP in the last 8760 hours.  ProBNP (last 3 results) No results for input(s): PROBNP in the last 8760 hours.  Radiological Exams: IR GASTROSTOMY TUBE MOD SED  Result Date: 09/13/2021 CLINICAL DATA:  Respiratory failure after prior trauma. Need for  percutaneous gastrostomy tube for long-term nutrition. EXAM: PERCUTANEOUS GASTROSTOMY TUBE PLACEMENT ANESTHESIA/SEDATION: Moderate (conscious) sedation was employed during this procedure. A total of Versed 1.0 mg and Fentanyl 25 mcg was administered intravenously by radiology nursing. Moderate Sedation Time: 33 minutes. The patient's level of consciousness and vital signs were monitored continuously by radiology nursing throughout the procedure under my direct supervision. CONTRAST:  10m OMNIPAQUE IOHEXOL 300 MG/ML SOLN MEDICATIONS: 2 g IV Ancef. IV antibiotic was administered in an appropriate time interval prior to needle puncture of the skin. FLUOROSCOPY TIME:  6 minutes and 30 seconds.  111 mGy. PROCEDURE: The procedure, risks, benefits, and alternatives were explained to the patient's aunt. Questions regarding the procedure were encouraged and answered. The patient aunt understands and consents to the procedure. The evening prior to the procedure, the patient was given water-soluble contrast in order to opacify the colon. A 5-French catheter was then advanced through the patient's mouth under fluoroscopy into the esophagus and to the level of the stomach. This catheter was used to insufflate the stomach with air under fluoroscopy. The abdominal wall was prepped with Betadine in a sterile fashion, and a sterile drape was applied covering the operative field. A sterile gown and sterile gloves were used for the procedure. Local anesthesia was provided with 1% Lidocaine. A skin incision was made in the upper abdominal wall. Under fluoroscopy, an 18 gauge trocar needle was advanced into the stomach. Contrast injection was performed to confirm intraluminal position of the  needle tip. A single T tack was then deployed in the lumen of the stomach. This was brought up to tension at the skin surface. Over a guidewire, a 9-French sheath was advanced into the lumen of the stomach. The wire was left in place as a safety  wire. A loop snare device from a percutaneous gastrostomy kit was then advanced into the stomach. A floppy guide wire was advanced through the orogastric catheter under fluoroscopy in the stomach. The loop snare advanced through the percutaneous gastric access was used to snare the guide wire. This allowed withdrawal of the loop snare out of the patient's mouth by retraction of the orogastric catheter and wire. A 20-French bumper retention gastrostomy tube was looped around the snare device. It was then pulled back through the patient's mouth. The retention bumper was brought up to the anterior gastric wall. The T tack suture was cut at the skin. The exiting gastrostomy tube was cut to appropriate length and a feeding adapter applied. The catheter was injected with contrast material to confirm position and a fluoroscopic spot image saved. The tube was then flushed with saline. A dressing was applied over the gastrostomy exit site. COMPLICATIONS: None. FINDINGS: Initial fluoroscopy demonstrates partial opacification of the colon by ingested contrast in order to minimize risk of colonic injury during the procedure. The stomach distended well with air allowing safe placement of the gastrostomy tube. After placement, the tip of the gastrostomy tube lies in the body of the stomach. IMPRESSION: Percutaneous gastrostomy with placement of a 20-French bumper retention tube in the body of the stomach. This tube can be used for percutaneous feeds beginning in 24 hours after placement. Electronically Signed   By: Aletta Edouard M.D.   On: 09/13/2021 15:39    Assessment/Plan Active Problems:   Acute on chronic respiratory failure with hypoxia (HCC)   Tracheostomy status (HCC)   Altered mental status, unspecified   Multiple trauma to chest   Increased tracheal secretions   Acute on chronic respiratory failure hypoxia we will continue with the T collar titrate oxygen as tolerated continue pulmonary  toilet. Tracheostomy remains in place at this time Altered mental status no change we will continue with supportive care Multiple chest trauma supportive care Retained secretions slowly improving   I have personally seen and evaluated the patient, evaluated laboratory and imaging results, formulated the assessment and plan and placed orders. The Patient requires high complexity decision making with multiple systems involvement.  Rounds were done with the Respiratory Therapy Director and Staff therapists and discussed with nursing staff also.  Allyne Gee, MD [facility] Pulmonary Critical Care Medicine Sleep Medicine

## 2021-09-17 DIAGNOSIS — S299XXS Unspecified injury of thorax, sequela: Secondary | ICD-10-CM | POA: Diagnosis not present

## 2021-09-17 DIAGNOSIS — J9621 Acute and chronic respiratory failure with hypoxia: Secondary | ICD-10-CM | POA: Diagnosis not present

## 2021-09-17 DIAGNOSIS — R4182 Altered mental status, unspecified: Secondary | ICD-10-CM | POA: Diagnosis not present

## 2021-09-17 DIAGNOSIS — J398 Other specified diseases of upper respiratory tract: Secondary | ICD-10-CM | POA: Diagnosis not present

## 2021-09-17 LAB — COMPREHENSIVE METABOLIC PANEL
ALT: 10 U/L (ref 0–44)
AST: 17 U/L (ref 15–41)
Albumin: 2.5 g/dL — ABNORMAL LOW (ref 3.5–5.0)
Alkaline Phosphatase: 89 U/L (ref 38–126)
Anion gap: 8 (ref 5–15)
BUN: 16 mg/dL (ref 6–20)
CO2: 31 mmol/L (ref 22–32)
Calcium: 9 mg/dL (ref 8.9–10.3)
Chloride: 106 mmol/L (ref 98–111)
Creatinine, Ser: 0.39 mg/dL — ABNORMAL LOW (ref 0.44–1.00)
GFR, Estimated: >60 mL/min (ref >60)
Glucose, Bld: 111 mg/dL — ABNORMAL HIGH (ref 70–99)
Potassium: 4.1 mmol/L (ref 3.5–5.1)
Sodium: 145 mmol/L (ref 135–145)
Total Bilirubin: 0.5 mg/dL (ref 0.3–1.2)
Total Protein: 7.2 g/dL (ref 6.5–8.1)

## 2021-09-17 LAB — PHOSPHORUS: Phosphorus: 4 mg/dL (ref 2.5–4.6)

## 2021-09-17 LAB — CBC
HCT: 32.7 % — ABNORMAL LOW (ref 36.0–46.0)
Hemoglobin: 10.1 g/dL — ABNORMAL LOW (ref 12.0–15.0)
MCH: 26.6 pg (ref 26.0–34.0)
MCHC: 30.9 g/dL (ref 30.0–36.0)
MCV: 86.1 fL (ref 80.0–100.0)
Platelets: 408 10*3/uL — ABNORMAL HIGH (ref 150–400)
RBC: 3.8 MIL/uL — ABNORMAL LOW (ref 3.87–5.11)
RDW: 16.1 % — ABNORMAL HIGH (ref 11.5–15.5)
WBC: 8.4 10*3/uL (ref 4.0–10.5)
nRBC: 0 % (ref 0.0–0.2)

## 2021-09-17 LAB — MAGNESIUM: Magnesium: 1.8 mg/dL (ref 1.7–2.4)

## 2021-09-17 NOTE — Progress Notes (Signed)
Pulmonary Critical Care Medicine [facility]   PULMONARY CRITICAL CARE SERVICE  PROGRESS NOTE     JAKI STEPTOE  DEY:[id number]  DOB: 1986/04/10   DOA: 08/28/2021  Referring Physician: Luna Kitchens, MD  HPI: KIAH VANALSTINE is a 36 y.o. female being followed for ventilator/airway/oxygen weaning Acute on Chronic Respiratory Failure.  Patient is afebrile resting comfortably without distress has been on T collar saturations are good  Medications: Reviewed on Rounds  Physical Exam:  Vitals: Temperature is 97.2 pulse 97 respiratory rate is 16 blood pressure is 105/65 saturations 97%  Ventilator Settings on T collar FiO2 is 28%  General: Comfortable at this time Neck: supple Cardiovascular: no malignant arrhythmias Respiratory: No rhonchi no rales are noted at this time Skin: no rash seen on limited exam Musculoskeletal: No gross abnormality Psychiatric:unable to assess Neurologic:no involuntary movements         Lab Data:   Basic Metabolic Panel: Recent Labs  Lab 09/12/21 0404 09/17/21 0658  NA 138 145  K 4.1 4.1  CL 99 106  CO2 32 31  GLUCOSE 145* 111*  BUN 15 16  CREATININE 0.59 0.39*  CALCIUM 9.3 9.0  MG 1.9 1.8  PHOS 4.5 4.0    ABG: No results for input(s): PHART, PCO2ART, PO2ART, HCO3, O2SAT in the last 168 hours.  Liver Function Tests: Recent Labs  Lab 09/12/21 0404 09/17/21 0658  AST  --  17  ALT  --  10  ALKPHOS  --  89  BILITOT  --  0.5  PROT  --  7.2  ALBUMIN 2.7* 2.5*   No results for input(s): LIPASE, AMYLASE in the last 168 hours. No results for input(s): AMMONIA in the last 168 hours.  CBC: Recent Labs  Lab 09/12/21 0404 09/17/21 0658  WBC 8.0 8.4  HGB 11.5* 10.1*  HCT 37.2 32.7*  MCV 87.3 86.1  PLT 499* 408*    Cardiac Enzymes: No results for input(s): CKTOTAL, CKMB, CKMBINDEX, TROPONINI in the last 168 hours.  BNP (last 3 results) No results for input(s): BNP in the last 8760 hours.  ProBNP  (last 3 results) No results for input(s): PROBNP in the last 8760 hours.  Radiological Exams: No results found.  Assessment/Plan Active Problems:   Acute on chronic respiratory failure with hypoxia (HCC)   Tracheostomy status (HCC)   Altered mental status, unspecified   Multiple trauma to chest   Increased tracheal secretions   Acute on chronic respiratory failure hypoxia we will continue with secretion management continue pulmonary toilet and supportive care Altered mental status no change supportive care Tracheostomy will remain in place Increased secretions requiring ongoing frequent suctioning Multiple chest trauma supportive care   I have personally seen and evaluated the patient, evaluated laboratory and imaging results, formulated the assessment and plan and placed orders. The Patient requires high complexity decision making with multiple systems involvement.  Rounds were done with the Respiratory Therapy Director and Staff therapists and discussed with nursing staff also.  Yevonne Pax, MD [facility] Pulmonary Critical Care Medicine Sleep Medicine

## 2021-09-18 DIAGNOSIS — J9621 Acute and chronic respiratory failure with hypoxia: Secondary | ICD-10-CM | POA: Diagnosis not present

## 2021-09-18 DIAGNOSIS — J398 Other specified diseases of upper respiratory tract: Secondary | ICD-10-CM | POA: Diagnosis not present

## 2021-09-18 DIAGNOSIS — S299XXS Unspecified injury of thorax, sequela: Secondary | ICD-10-CM | POA: Diagnosis not present

## 2021-09-18 DIAGNOSIS — R4182 Altered mental status, unspecified: Secondary | ICD-10-CM | POA: Diagnosis not present

## 2021-09-18 LAB — MAGNESIUM: Magnesium: 1.9 mg/dL (ref 1.7–2.4)

## 2021-09-18 NOTE — Progress Notes (Signed)
Pulmonary Critical Care Medicine [place]  PROGRESS NOTE     Dana Noble  [id number]  DOB: June 29, 1986   DOA: 08/28/2021  Referring Physician: Satira Sark, MD  HPI: Dana Noble is a 36 y.o. female being followed for ventilator/airway/oxygen weaning Acute on Chronic Respiratory Failure.  She remains on T-piece secretions are moderate.  Comfortable without any distress  Medications: Reviewed on Rounds  Physical Exam:  Vitals: Temperature is 98.7 pulse 84 respiratory 16 blood pressure is 120/69 saturations 100%  Ventilator Settings on T collar FiO2 is 28%  General: Comfortable at this time Neck: supple Cardiovascular: no malignant arrhythmias Respiratory: Scattered rhonchi expansion is equal Skin: no rash seen on limited exam Musculoskeletal: No gross abnormality Psychiatric:unable to assess Neurologic:no involuntary movements         Lab Data:   Basic Metabolic Panel: Recent Labs  Lab 09/12/21 0404 09/17/21 0658  NA 138 145  K 4.1 4.1  CL 99 106  CO2 32 31  GLUCOSE 145* 111*  BUN 15 16  CREATININE 0.59 0.39*  CALCIUM 9.3 9.0  MG 1.9 1.8  PHOS 4.5 4.0    ABG: No results for input(s): PHART, PCO2ART, PO2ART, HCO3, O2SAT in the last 168 hours.  Liver Function Tests: Recent Labs  Lab 09/12/21 0404 09/17/21 0658  AST  --  17  ALT  --  10  ALKPHOS  --  89  BILITOT  --  0.5  PROT  --  7.2  ALBUMIN 2.7* 2.5*   No results for input(s): LIPASE, AMYLASE in the last 168 hours. No results for input(s): AMMONIA in the last 168 hours.  CBC: Recent Labs  Lab 09/12/21 0404 09/17/21 0658  WBC 8.0 8.4  HGB 11.5* 10.1*  HCT 37.2 32.7*  MCV 87.3 86.1  PLT 499* 408*    Cardiac Enzymes: No results for input(s): CKTOTAL, CKMB, CKMBINDEX, TROPONINI in the last 168 hours.  BNP (last 3 results) No results for input(s): BNP in the last 8760 hours.  ProBNP (last 3 results) No results  for input(s): PROBNP in the last 8760 hours.  Radiological Exams: No results found.  Assessment/Plan Active Problems:   Acute on chronic respiratory failure with hypoxia (HCC)   Tracheostomy status (HCC)   Altered mental status, unspecified   Multiple trauma to chest   Increased tracheal secretions   Acute on chronic respiratory failure hypoxia we will continue with the T collar trials patient is on 28% FiO2 continue secretion management pulmonary toilet. Tracheostomy remains in place at this time Altered mental status no change continue to follow along Multiple chest trauma supportive care Increased secretions slowly improving   I have personally seen and evaluated the patient, evaluated laboratory and imaging results, formulated the assessment and plan and placed orders. The Patient requires high complexity decision making with multiple systems involvement.  Rounds were done with the Respiratory Therapy Director and Staff therapists and discussed with nursing staff also.  Allyne Gee, MD [facility] Pulmonary Critical Care Medicine Sleep Medicine

## 2021-09-19 DIAGNOSIS — R4182 Altered mental status, unspecified: Secondary | ICD-10-CM | POA: Diagnosis not present

## 2021-09-19 DIAGNOSIS — Z930 Tracheostomy status: Secondary | ICD-10-CM | POA: Diagnosis not present

## 2021-09-19 DIAGNOSIS — J398 Other specified diseases of upper respiratory tract: Secondary | ICD-10-CM | POA: Diagnosis not present

## 2021-09-19 DIAGNOSIS — J9621 Acute and chronic respiratory failure with hypoxia: Secondary | ICD-10-CM | POA: Diagnosis not present

## 2021-09-19 LAB — CBC
HCT: 35.6 % — ABNORMAL LOW (ref 36.0–46.0)
Hemoglobin: 10.6 g/dL — ABNORMAL LOW (ref 12.0–15.0)
MCH: 25.8 pg — ABNORMAL LOW (ref 26.0–34.0)
MCHC: 29.8 g/dL — ABNORMAL LOW (ref 30.0–36.0)
MCV: 86.6 fL (ref 80.0–100.0)
Platelets: 432 10*3/uL — ABNORMAL HIGH (ref 150–400)
RBC: 4.11 MIL/uL (ref 3.87–5.11)
RDW: 15.9 % — ABNORMAL HIGH (ref 11.5–15.5)
WBC: 8.1 10*3/uL (ref 4.0–10.5)
nRBC: 0 % (ref 0.0–0.2)

## 2021-09-19 LAB — BASIC METABOLIC PANEL
Anion gap: 8 (ref 5–15)
BUN: 11 mg/dL (ref 6–20)
CO2: 30 mmol/L (ref 22–32)
Calcium: 9.2 mg/dL (ref 8.9–10.3)
Chloride: 106 mmol/L (ref 98–111)
Creatinine, Ser: 0.43 mg/dL — ABNORMAL LOW (ref 0.44–1.00)
GFR, Estimated: >60 mL/min (ref >60)
Glucose, Bld: 119 mg/dL — ABNORMAL HIGH (ref 70–99)
Potassium: 3.9 mmol/L (ref 3.5–5.1)
Sodium: 144 mmol/L (ref 135–145)

## 2021-09-19 LAB — PHOSPHORUS: Phosphorus: 3.6 mg/dL (ref 2.5–4.6)

## 2021-09-19 LAB — MAGNESIUM: Magnesium: 1.8 mg/dL (ref 1.7–2.4)

## 2021-09-19 NOTE — Progress Notes (Signed)
Pulmonary Critical Care Medicine [place]  PROGRESS NOTE     Dana Noble  [id number]  DOB: 30-Jun-1986   DOA: 08/28/2021  Referring Physician: Satira Sark, MD  HPI: Dana Noble is a 36 y.o. female being followed for ventilator/airway/oxygen weaning Acute on Chronic Respiratory Failure.  Patient is on T-piece on 28% FiO2 secretions are copious  Medications: Reviewed on Rounds  Physical Exam:  Vitals: Temperature is 96.9 pulse 91 and status 14 blood pressure is 168/84 saturations 98%  Ventilator Settings on T collar at this time 28% FiO2  General: Comfortable at this time Neck: supple Cardiovascular: no malignant arrhythmias Respiratory: No rhonchi no rales are noted Skin: no rash seen on limited exam Musculoskeletal: No gross abnormality Psychiatric:unable to assess Neurologic:no involuntary movements         Lab Data:   Basic Metabolic Panel: Recent Labs  Lab 09/17/21 0658 09/18/21 1050 09/19/21 0739  NA 145  --  144  K 4.1  --  3.9  CL 106  --  106  CO2 31  --  30  GLUCOSE 111*  --  119*  BUN 16  --  11  CREATININE 0.39*  --  0.43*  CALCIUM 9.0  --  9.2  MG 1.8 1.9  --   PHOS 4.0  --  3.6    ABG: No results for input(s): PHART, PCO2ART, PO2ART, HCO3, O2SAT in the last 168 hours.  Liver Function Tests: Recent Labs  Lab 09/17/21 0658  AST 17  ALT 10  ALKPHOS 89  BILITOT 0.5  PROT 7.2  ALBUMIN 2.5*   No results for input(s): LIPASE, AMYLASE in the last 168 hours. No results for input(s): AMMONIA in the last 168 hours.  CBC: Recent Labs  Lab 09/17/21 0658 09/19/21 0739  WBC 8.4 8.1  HGB 10.1* 10.6*  HCT 32.7* 35.6*  MCV 86.1 86.6  PLT 408* 432*    Cardiac Enzymes: No results for input(s): CKTOTAL, CKMB, CKMBINDEX, TROPONINI in the last 168 hours.  BNP (last 3 results) No results for input(s): BNP in the last 8760 hours.  ProBNP (last 3 results) No results  for input(s): PROBNP in the last 8760 hours.  Radiological Exams: No results found.  Assessment/Plan Active Problems:   Acute on chronic respiratory failure with hypoxia (HCC)   Tracheostomy status (HCC)   Altered mental status, unspecified   Multiple trauma to chest   Increased tracheal secretions   Acute on chronic respiratory failure hypoxia we will continue with the T-piece titrate oxygen as tolerated continue pulmonary toilet. Tracheostomy remains in place we will continue to monitor along closely. Altered mental state no change Chest trauma supportive care Retained secretions continue pulmonary toilet   I have personally seen and evaluated the patient, evaluated laboratory and imaging results, formulated the assessment and plan and placed orders. The Patient requires high complexity decision making with multiple systems involvement.  Rounds were done with the Respiratory Therapy Director and Staff therapists and discussed with nursing staff also.  Allyne Gee, MD [facility] Pulmonary Critical Care Medicine Sleep Medicine

## 2021-09-20 DIAGNOSIS — R4182 Altered mental status, unspecified: Secondary | ICD-10-CM | POA: Diagnosis not present

## 2021-09-20 DIAGNOSIS — J398 Other specified diseases of upper respiratory tract: Secondary | ICD-10-CM | POA: Diagnosis not present

## 2021-09-20 DIAGNOSIS — J9621 Acute and chronic respiratory failure with hypoxia: Secondary | ICD-10-CM | POA: Diagnosis not present

## 2021-09-20 DIAGNOSIS — S299XXS Unspecified injury of thorax, sequela: Secondary | ICD-10-CM | POA: Diagnosis not present

## 2021-09-20 NOTE — Progress Notes (Signed)
Pulmonary Critical Care Medicine [place]  PROGRESS NOTE     Dana Noble  [id number]  DOB: 1986-01-31   DOA: 08/28/2021  Referring Physician: Satira Sark, MD  HPI: Dana Noble is a 36 y.o. female being followed for ventilator/airway/oxygen weaning Acute on Chronic Respiratory Failure.  Patient is on T collar on 28% FiO2 secretions are still reportedly copious  Medications: Reviewed on Rounds  Physical Exam:  Vitals: Temperature 96.8 pulse 85 respiratory rate is 18 blood pressure is 128/83 saturations 99%  Ventilator Settings on T collar FiO2 28%  General: Comfortable at this time Neck: supple Cardiovascular: no malignant arrhythmias Respiratory: No rhonchi very coarse breath sounds Skin: no rash seen on limited exam Musculoskeletal: No gross abnormality Psychiatric:unable to assess Neurologic:no involuntary movements         Lab Data:   Basic Metabolic Panel: Recent Labs  Lab 09/17/21 0658 09/18/21 1050 09/19/21 0739  NA 145  --  144  K 4.1  --  3.9  CL 106  --  106  CO2 31  --  30  GLUCOSE 111*  --  119*  BUN 16  --  11  CREATININE 0.39*  --  0.43*  CALCIUM 9.0  --  9.2  MG 1.8 1.9 1.8  PHOS 4.0  --  3.6    ABG: No results for input(s): PHART, PCO2ART, PO2ART, HCO3, O2SAT in the last 168 hours.  Liver Function Tests: Recent Labs  Lab 09/17/21 0658  AST 17  ALT 10  ALKPHOS 89  BILITOT 0.5  PROT 7.2  ALBUMIN 2.5*   No results for input(s): LIPASE, AMYLASE in the last 168 hours. No results for input(s): AMMONIA in the last 168 hours.  CBC: Recent Labs  Lab 09/17/21 0658 09/19/21 0739  WBC 8.4 8.1  HGB 10.1* 10.6*  HCT 32.7* 35.6*  MCV 86.1 86.6  PLT 408* 432*    Cardiac Enzymes: No results for input(s): CKTOTAL, CKMB, CKMBINDEX, TROPONINI in the last 168 hours.  BNP (last 3 results) No results for input(s): BNP in the last 8760 hours.  ProBNP (last 3  results) No results for input(s): PROBNP in the last 8760 hours.  Radiological Exams: No results found.  Assessment/Plan Active Problems:   Acute on chronic respiratory failure with hypoxia (HCC)   Tracheostomy status (HCC)   Altered mental status, unspecified   Multiple trauma to chest   Increased tracheal secretions   Acute on chronic respiratory failure hypoxia plan is to continue with the T-piece secretions are copious will need to continue pulmonary toilet. Tracheostomy no change we will continue to follow along closely Altered mental status supportive care Multiple chest trauma no change Retained secretions continue aggressive pulmonary toilet   I have personally seen and evaluated the patient, evaluated laboratory and imaging results, formulated the assessment and plan and placed orders. The Patient requires high complexity decision making with multiple systems involvement.  Rounds were done with the Respiratory Therapy Director and Staff therapists and discussed with nursing staff also.  Allyne Gee, MD [facility] Pulmonary Critical Care Medicine Sleep Medicine

## 2021-09-21 DIAGNOSIS — J398 Other specified diseases of upper respiratory tract: Secondary | ICD-10-CM | POA: Diagnosis not present

## 2021-09-21 DIAGNOSIS — J9621 Acute and chronic respiratory failure with hypoxia: Secondary | ICD-10-CM | POA: Diagnosis not present

## 2021-09-21 DIAGNOSIS — S299XXS Unspecified injury of thorax, sequela: Secondary | ICD-10-CM | POA: Diagnosis not present

## 2021-09-21 DIAGNOSIS — R4182 Altered mental status, unspecified: Secondary | ICD-10-CM | POA: Diagnosis not present

## 2021-09-21 LAB — BLOOD GAS, ARTERIAL
Acid-Base Excess: 5.9 mmol/L — ABNORMAL HIGH (ref 0.0–2.0)
Bicarbonate: 29.8 mmol/L — ABNORMAL HIGH (ref 20.0–28.0)
FIO2: 28
O2 Saturation: 93.3 %
Patient temperature: 36.4
pCO2 arterial: 41.7 mmHg (ref 32.0–48.0)
pH, Arterial: 7.466 — ABNORMAL HIGH (ref 7.350–7.450)
pO2, Arterial: 62.5 mmHg — ABNORMAL LOW (ref 83.0–108.0)

## 2021-09-21 NOTE — Progress Notes (Signed)
Pulmonary Critical Care Medicine [place]  PROGRESS NOTE     Dana Noble  [id number]  DOB: 14-May-1986   DOA: 08/28/2021  Referring Physician: Satira Sark, MD  HPI: Dana Noble is a 36 y.o. female being followed for ventilator/airway/oxygen weaning Acute on Chronic Respiratory Failure.  Patient is afebrile comfortable without distress apparently had an episode of vomiting and possibly aspirated overnight  Medications: Reviewed on Rounds  Physical Exam:  Vitals: Temperature is 97.6 pulse 80 respiratory rate is 18 blood pressure is 120/75 saturations 100%  Ventilator Settings on T collar FiO2 is 28%  General: Comfortable at this time Neck: supple Cardiovascular: no malignant arrhythmias Respiratory: Scattered rhonchi very coarse breath sounds Skin: no rash seen on limited exam Musculoskeletal: No gross abnormality Psychiatric:unable to assess Neurologic:no involuntary movements         Lab Data:   Basic Metabolic Panel: Recent Labs  Lab 09/17/21 0658 09/18/21 1050 09/19/21 0739  NA 145  --  144  K 4.1  --  3.9  CL 106  --  106  CO2 31  --  30  GLUCOSE 111*  --  119*  BUN 16  --  11  CREATININE 0.39*  --  0.43*  CALCIUM 9.0  --  9.2  MG 1.8 1.9 1.8  PHOS 4.0  --  3.6    ABG: No results for input(s): PHART, PCO2ART, PO2ART, HCO3, O2SAT in the last 168 hours.  Liver Function Tests: Recent Labs  Lab 09/17/21 0658  AST 17  ALT 10  ALKPHOS 89  BILITOT 0.5  PROT 7.2  ALBUMIN 2.5*   No results for input(s): LIPASE, AMYLASE in the last 168 hours. No results for input(s): AMMONIA in the last 168 hours.  CBC: Recent Labs  Lab 09/17/21 0658 09/19/21 0739  WBC 8.4 8.1  HGB 10.1* 10.6*  HCT 32.7* 35.6*  MCV 86.1 86.6  PLT 408* 432*    Cardiac Enzymes: No results for input(s): CKTOTAL, CKMB, CKMBINDEX, TROPONINI in the last 168 hours.  BNP (last 3 results) No results for  input(s): BNP in the last 8760 hours.  ProBNP (last 3 results) No results for input(s): PROBNP in the last 8760 hours.  Radiological Exams: No results found.  Assessment/Plan Active Problems:   Acute on chronic respiratory failure with hypoxia (HCC)   Tracheostomy status (HCC)   Altered mental status, unspecified   Multiple trauma to chest   Increased tracheal secretions   Acute on chronic respiratory failure hypoxia plan is to continue with the T-piece for now.  Mild will be worked up for possibility of aspiration Tracheostomy remains in place Altered mental status no change we will continue to follow along closely. Multiple chest trauma supportive care Retained secretions needs ongoing pulmonary toileting   I have personally seen and evaluated the patient, evaluated laboratory and imaging results, formulated the assessment and plan and placed orders. The Patient requires high complexity decision making with multiple systems involvement.  Rounds were done with the Respiratory Therapy Director and Staff therapists and discussed with nursing staff also.  Allyne Gee, MD [facility] Pulmonary Critical Care Medicine Sleep Medicine

## 2021-09-22 LAB — BASIC METABOLIC PANEL
Anion gap: 7 (ref 5–15)
BUN: 18 mg/dL (ref 6–20)
CO2: 29 mmol/L (ref 22–32)
Calcium: 9.1 mg/dL (ref 8.9–10.3)
Chloride: 106 mmol/L (ref 98–111)
Creatinine, Ser: 0.46 mg/dL (ref 0.44–1.00)
GFR, Estimated: >60 mL/min (ref >60)
Glucose, Bld: 107 mg/dL — ABNORMAL HIGH (ref 70–99)
Potassium: 4 mmol/L (ref 3.5–5.1)
Sodium: 142 mmol/L (ref 135–145)

## 2021-09-22 LAB — CBC
HCT: 34.7 % — ABNORMAL LOW (ref 36.0–46.0)
Hemoglobin: 10.7 g/dL — ABNORMAL LOW (ref 12.0–15.0)
MCH: 26.6 pg (ref 26.0–34.0)
MCHC: 30.8 g/dL (ref 30.0–36.0)
MCV: 86.1 fL (ref 80.0–100.0)
Platelets: 407 10*3/uL — ABNORMAL HIGH (ref 150–400)
RBC: 4.03 MIL/uL (ref 3.87–5.11)
RDW: 15.7 % — ABNORMAL HIGH (ref 11.5–15.5)
WBC: 7 10*3/uL (ref 4.0–10.5)
nRBC: 0 % (ref 0.0–0.2)

## 2021-09-22 LAB — PHOSPHORUS: Phosphorus: 4.2 mg/dL (ref 2.5–4.6)

## 2021-09-22 LAB — MAGNESIUM: Magnesium: 1.8 mg/dL (ref 1.7–2.4)

## 2021-09-23 DIAGNOSIS — J398 Other specified diseases of upper respiratory tract: Secondary | ICD-10-CM | POA: Diagnosis not present

## 2021-09-23 DIAGNOSIS — Z930 Tracheostomy status: Secondary | ICD-10-CM | POA: Diagnosis not present

## 2021-09-23 DIAGNOSIS — J9621 Acute and chronic respiratory failure with hypoxia: Secondary | ICD-10-CM | POA: Diagnosis not present

## 2021-09-23 DIAGNOSIS — R4182 Altered mental status, unspecified: Secondary | ICD-10-CM | POA: Diagnosis not present

## 2021-09-23 NOTE — Progress Notes (Signed)
Pulmonary Critical Care Medicine [facility]   PULMONARY CRITICAL CARE SERVICE  PROGRESS NOTE     Dana Noble  [id number]  DOB: [birth year]   DOA: 08/28/2021  Referring Physician: Luna Kitchens, MD  HPI: Dana Noble is a 36 y.o. female being followed for ventilator/airway/oxygen weaning Acute on Chronic Respiratory Failure.  Patient is afebrile resting comfortably has been on the T-piece using the PMV  Medications: Reviewed on Rounds  Physical Exam:  Vitals: Temperature is 97.6 pulse 79 respiratory 26 blood pressure is 137/88 saturations 96%  Ventilator Settings on T collar FiO2 28%  General: Comfortable at this time Neck: supple Cardiovascular: no malignant arrhythmias Respiratory: No rhonchi or rales are noted at this time Skin: no rash seen on limited exam Musculoskeletal: No gross abnormality Psychiatric:unable to assess Neurologic:no involuntary movements         Lab Data:   Basic Metabolic Panel: Recent Labs  Lab 09/17/21 0658 09/18/21 1050 09/19/21 0739 09/22/21 0434  NA 145  --  144 142  K 4.1  --  3.9 4.0  CL 106  --  106 106  CO2 31  --  30 29  GLUCOSE 111*  --  119* 107*  BUN 16  --  11 18  CREATININE 0.39*  --  0.43* 0.46  CALCIUM 9.0  --  9.2 9.1  MG 1.8 1.9 1.8 1.8  PHOS 4.0  --  3.6 4.2    ABG: Recent Labs  Lab 09/21/21 2211  PHART 7.466*  PCO2ART 41.7  PO2ART 62.5*  HCO3 29.8*  O2SAT 93.3    Liver Function Tests: Recent Labs  Lab 09/17/21 0658  AST 17  ALT 10  ALKPHOS 89  BILITOT 0.5  PROT 7.2  ALBUMIN 2.5*   No results for input(s): LIPASE, AMYLASE in the last 168 hours. No results for input(s): AMMONIA in the last 168 hours.  CBC: Recent Labs  Lab 09/17/21 0658 09/19/21 0739 09/22/21 0434  WBC 8.4 8.1 7.0  HGB 10.1* 10.6* 10.7*  HCT 32.7* 35.6* 34.7*  MCV 86.1 86.6 86.1  PLT 408* 432* 407*    Cardiac Enzymes: No results for input(s): CKTOTAL, CKMB, CKMBINDEX, TROPONINI  in the last 168 hours.  BNP (last 3 results) No results for input(s): BNP in the last 8760 hours.  ProBNP (last 3 results) No results for input(s): PROBNP in the last 8760 hours.  Radiological Exams: No results found.  Assessment/Plan Active Problems:   Acute on chronic respiratory failure with hypoxia (HCC)   Tracheostomy status (HCC)   Altered mental status, unspecified   Multiple trauma to chest   Increased tracheal secretions   Acute on chronic respiratory failure hypoxia we will continue with T collar titrate oxygen as tolerated continue pulmonary toilet. Altered mental status no change we will continue to follow along closely Multiple chest trauma supportive care Tracheostomy remains in place Increased tracheal secretions continue pulmonary toilet   I have personally seen and evaluated the patient, evaluated laboratory and imaging results, formulated the assessment and plan and placed orders. The Patient requires high complexity decision making with multiple systems involvement.  Rounds were done with the Respiratory Therapy Director and Staff therapists and discussed with nursing staff also.  Yevonne Pax, MD [facility] Pulmonary Critical Care Medicine Sleep Medicine
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
## 2021-09-24 DIAGNOSIS — S299XXS Unspecified injury of thorax, sequela: Secondary | ICD-10-CM | POA: Diagnosis not present

## 2021-09-24 DIAGNOSIS — R4182 Altered mental status, unspecified: Secondary | ICD-10-CM | POA: Diagnosis not present

## 2021-09-24 DIAGNOSIS — J398 Other specified diseases of upper respiratory tract: Secondary | ICD-10-CM | POA: Diagnosis not present

## 2021-09-24 DIAGNOSIS — J9621 Acute and chronic respiratory failure with hypoxia: Secondary | ICD-10-CM | POA: Diagnosis not present

## 2021-09-24 NOTE — Progress Notes (Signed)
Pulmonary Critical Care Medicine [place]  PROGRESS NOTE     Dana Noble  [id number]  DOB: 16-Jul-1986   DOA: 08/28/2021  Referring Physician: Satira Sark, MD  HPI: Dana Noble is a 36 y.o. female being followed for ventilator/airway/oxygen weaning Acute on Chronic Respiratory Failure.  Patient is on T collar on 28% FiO2 with the PMV  Medications: Reviewed on Rounds  Physical Exam:  Vitals: Temperature is 97.1 pulse 88 respiratory is 20 blood pressure is 117/63 saturations 95%  Ventilator Settings patient is on T collar 28% FiO2 using PMV  General: Comfortable at this time Neck: supple Cardiovascular: no malignant arrhythmias Respiratory: No rhonchi no rales are noted at this time Skin: no rash seen on limited exam Musculoskeletal: No gross abnormality Psychiatric:unable to assess Neurologic:no involuntary movements         Lab Data:   Basic Metabolic Panel: Recent Labs  Lab 09/18/21 1050 09/19/21 0739 09/22/21 0434  NA  --  144 142  K  --  3.9 4.0  CL  --  106 106  CO2  --  30 29  GLUCOSE  --  119* 107*  BUN  --  11 18  CREATININE  --  0.43* 0.46  CALCIUM  --  9.2 9.1  MG 1.9 1.8 1.8  PHOS  --  3.6 4.2    ABG: Recent Labs  Lab 09/21/21 2211  PHART 7.466*  PCO2ART 41.7  PO2ART 62.5*  HCO3 29.8*  O2SAT 93.3    Liver Function Tests: No results for input(s): AST, ALT, ALKPHOS, BILITOT, PROT, ALBUMIN in the last 168 hours. No results for input(s): LIPASE, AMYLASE in the last 168 hours. No results for input(s): AMMONIA in the last 168 hours.  CBC: Recent Labs  Lab 09/19/21 0739 09/22/21 0434  WBC 8.1 7.0  HGB 10.6* 10.7*  HCT 35.6* 34.7*  MCV 86.6 86.1  PLT 432* 407*    Cardiac Enzymes: No results for input(s): CKTOTAL, CKMB, CKMBINDEX, TROPONINI in the last 168 hours.  BNP (last 3 results) No results for input(s): BNP in the last 8760 hours.  ProBNP (last 3  results) No results for input(s): PROBNP in the last 8760 hours.  Radiological Exams: No results found.  Assessment/Plan Active Problems:   Acute on chronic respiratory failure with hypoxia (HCC)   Tracheostomy status (HCC)   Altered mental status, unspecified   Multiple trauma to chest   Increased tracheal secretions   Acute on chronic respiratory failure hypoxia we will continue with the PMV we will continue with T-piece titrate oxygen as tolerated continue pulmonary toilet. Altered mental status no change we will continue with supportive care Multiple chest trauma no change Retained secretions continue aggressive pulmonary toilet Tracheostomy remains in place   I have personally seen and evaluated the patient, evaluated laboratory and imaging results, formulated the assessment and plan and placed orders. The Patient requires high complexity decision making with multiple systems involvement.  Rounds were done with the Respiratory Therapy Director and Staff therapists and discussed with nursing staff also.  Allyne Gee, MD [facility] Pulmonary Critical Care Medicine Sleep Medicine

## 2021-09-25 DIAGNOSIS — J9621 Acute and chronic respiratory failure with hypoxia: Secondary | ICD-10-CM | POA: Diagnosis not present

## 2021-09-25 DIAGNOSIS — S299XXS Unspecified injury of thorax, sequela: Secondary | ICD-10-CM | POA: Diagnosis not present

## 2021-09-25 DIAGNOSIS — J398 Other specified diseases of upper respiratory tract: Secondary | ICD-10-CM | POA: Diagnosis not present

## 2021-09-25 DIAGNOSIS — R4182 Altered mental status, unspecified: Secondary | ICD-10-CM | POA: Diagnosis not present

## 2021-09-25 NOTE — Progress Notes (Signed)
Pulmonary Critical Care Medicine [facility]   PULMONARY CRITICAL CARE SERVICE  PROGRESS NOTE     [name]  ZWC:[id number]  DOB: 1985-10-04   DOA: 08/28/2021  Referring Physician: Luna Kitchens, MD  HPI: Dana Noble is a 36 y.o. female being followed for ventilator/airway/oxygen weaning Acute on Chronic Respiratory Failure.  Patient is off the ventilator on T-piece on 28% FiO2 using the PMV  Medications: Reviewed on Rounds  Physical Exam:  Vitals: Temperature is 97.0 pulse 87 respiratory 16 blood pressure is 147/80 saturations 94%  Ventilator Settings on T-piece on 28% FiO2 with PMV  General: Comfortable at this time Neck: supple Cardiovascular: no malignant arrhythmias Respiratory: No rhonchi very coarse breath sounds Skin: no rash seen on limited exam Musculoskeletal: No gross abnormality Psychiatric:unable to assess Neurologic:no involuntary movements         Lab Data:   Basic Metabolic Panel: Recent Labs  Lab 09/18/21 1050 09/19/21 0739 09/22/21 0434  NA  --  144 142  K  --  3.9 4.0  CL  --  106 106  CO2  --  30 29  GLUCOSE  --  119* 107*  BUN  --  11 18  CREATININE  --  0.43* 0.46  CALCIUM  --  9.2 9.1  MG 1.9 1.8 1.8  PHOS  --  3.6 4.2    ABG: Recent Labs  Lab 09/21/21 2211  PHART 7.466*  PCO2ART 41.7  PO2ART 62.5*  HCO3 29.8*  O2SAT 93.3    Liver Function Tests: No results for input(s): AST, ALT, ALKPHOS, BILITOT, PROT, ALBUMIN in the last 168 hours. No results for input(s): LIPASE, AMYLASE in the last 168 hours. No results for input(s): AMMONIA in the last 168 hours.  CBC: Recent Labs  Lab 09/19/21 0739 09/22/21 0434  WBC 8.1 7.0  HGB 10.6* 10.7*  HCT 35.6* 34.7*  MCV 86.6 86.1  PLT 432* 407*    Cardiac Enzymes: No results for input(s): CKTOTAL, CKMB, CKMBINDEX, TROPONINI in the last 168 hours.  BNP (last 3 results) No results for input(s): BNP in the last 8760 hours.  ProBNP (last 3  results) No results for input(s): PROBNP in the last 8760 hours.  Radiological Exams: No results found.  Assessment/Plan Active Problems:   Acute on chronic respiratory failure with hypoxia (HCC)   Tracheostomy status (HCC)   Altered mental status, unspecified   Multiple trauma to chest   Increased tracheal secretions   Acute on chronic respiratory failure hypoxia on T-piece patient on 28% FiO2 we will continue with the PMV Tracheostomy remains in place we will continue to follow along closely. Altered mental status no change continue with supportive care Multiple trauma to the chest supportive care Increase tracheal secretions patient at baseline right now we will continue to follow along   I have personally seen and evaluated the patient, evaluated laboratory and imaging results, formulated the assessment and plan and placed orders. The Patient requires high complexity decision making with multiple systems involvement.  Rounds were done with the Respiratory Therapy Director and Staff therapists and discussed with nursing staff also.  Yevonne Pax, MD [facility] Pulmonary Critical Care Medicine Sleep Medicine

## 2021-09-26 ENCOUNTER — Other Ambulatory Visit (HOSPITAL_COMMUNITY): Payer: Medicare Other

## 2021-09-26 DIAGNOSIS — J398 Other specified diseases of upper respiratory tract: Secondary | ICD-10-CM | POA: Diagnosis not present

## 2021-09-26 DIAGNOSIS — J9621 Acute and chronic respiratory failure with hypoxia: Secondary | ICD-10-CM | POA: Diagnosis not present

## 2021-09-26 DIAGNOSIS — R4182 Altered mental status, unspecified: Secondary | ICD-10-CM | POA: Diagnosis not present

## 2021-09-26 DIAGNOSIS — Z930 Tracheostomy status: Secondary | ICD-10-CM | POA: Diagnosis not present

## 2021-09-26 LAB — CBC
HCT: 36.3 % (ref 36.0–46.0)
Hemoglobin: 11.1 g/dL — ABNORMAL LOW (ref 12.0–15.0)
MCH: 25.9 pg — ABNORMAL LOW (ref 26.0–34.0)
MCHC: 30.6 g/dL (ref 30.0–36.0)
MCV: 84.6 fL (ref 80.0–100.0)
Platelets: 399 10*3/uL (ref 150–400)
RBC: 4.29 MIL/uL (ref 3.87–5.11)
RDW: 15.7 % — ABNORMAL HIGH (ref 11.5–15.5)
WBC: 5.8 10*3/uL (ref 4.0–10.5)
nRBC: 0 % (ref 0.0–0.2)

## 2021-09-26 LAB — BASIC METABOLIC PANEL
Anion gap: 7 (ref 5–15)
BUN: 15 mg/dL (ref 6–20)
CO2: 32 mmol/L (ref 22–32)
Calcium: 9.3 mg/dL (ref 8.9–10.3)
Chloride: 101 mmol/L (ref 98–111)
Creatinine, Ser: 0.42 mg/dL — ABNORMAL LOW (ref 0.44–1.00)
GFR, Estimated: >60 mL/min (ref >60)
Glucose, Bld: 103 mg/dL — ABNORMAL HIGH (ref 70–99)
Potassium: 4.6 mmol/L (ref 3.5–5.1)
Sodium: 140 mmol/L (ref 135–145)

## 2021-09-26 LAB — MAGNESIUM: Magnesium: 1.6 mg/dL — ABNORMAL LOW (ref 1.7–2.4)

## 2021-09-26 NOTE — Progress Notes (Signed)
Pulmonary Critical Care Medicine [facility]   PULMONARY CRITICAL CARE SERVICE  PROGRESS NOTE     ANNALY Noble  INO:[id number]  DOB: Nov 08, 1985   DOA: 08/28/2021  Referring Physician: Luna Kitchens, MD  HPI: Dana Noble is a 36 y.o. female being followed for ventilator/airway/oxygen weaning Acute on Chronic Respiratory Failure.  Patient is on T-piece has been on 28% FiO2 she is using the PMV she wanted something to drink this morning I explained to her the need to have speech evaluate her  Medications: Reviewed on Rounds  Physical Exam:  Vitals: Temperature is 96.8 pulse 81 respiratory 22 blood pressure is 116/72 saturations 100%  Ventilator Settings on T-piece on 28% FiO2 with the PMV  General: Comfortable at this time Neck: supple Cardiovascular: no malignant arrhythmias Respiratory: Scattered rhonchi very coarse breath sounds Skin: no rash seen on limited exam Musculoskeletal: No gross abnormality Psychiatric:unable to assess Neurologic:no involuntary movements         Lab Data:   Basic Metabolic Panel: Recent Labs  Lab 09/22/21 0434 09/26/21 0443  NA 142 140  K 4.0 4.6  CL 106 101  CO2 29 32  GLUCOSE 107* 103*  BUN 18 15  CREATININE 0.46 0.42*  CALCIUM 9.1 9.3  MG 1.8 1.6*  PHOS 4.2  --     ABG: Recent Labs  Lab 09/21/21 2211  PHART 7.466*  PCO2ART 41.7  PO2ART 62.5*  HCO3 29.8*  O2SAT 93.3    Liver Function Tests: No results for input(s): AST, ALT, ALKPHOS, BILITOT, PROT, ALBUMIN in the last 168 hours. No results for input(s): LIPASE, AMYLASE in the last 168 hours. No results for input(s): AMMONIA in the last 168 hours.  CBC: Recent Labs  Lab 09/22/21 0434 09/26/21 0443  WBC 7.0 5.8  HGB 10.7* 11.1*  HCT 34.7* 36.3  MCV 86.1 84.6  PLT 407* 399    Cardiac Enzymes: No results for input(s): CKTOTAL, CKMB, CKMBINDEX, TROPONINI in the last 168 hours.  BNP (last 3 results) No results for input(s): BNP  in the last 8760 hours.  ProBNP (last 3 results) No results for input(s): PROBNP in the last 8760 hours.  Radiological Exams: No results found.  Assessment/Plan Active Problems:   Acute on chronic respiratory failure with hypoxia (HCC)   Tracheostomy status (HCC)   Altered mental status, unspecified   Multiple trauma to chest   Increased tracheal secretions   Acute on chronic respiratory failure hypoxia we will continue with PMV as tolerated continue pulmonary toilet and secretion management. Altered mental status no change we will continue with supportive care Multiple chest trauma continue to monitor closely. Retained secretions continue pulmonary toilet Tracheostomy remains in place at this time tolerating PMV   I have personally seen and evaluated the patient, evaluated laboratory and imaging results, formulated the assessment and plan and placed orders. The Patient requires high complexity decision making with multiple systems involvement.  Rounds were done with the Respiratory Therapy Director and Staff therapists and discussed with nursing staff also.  Dana Pax, MD  [facility] Pulmonary Critical Care Medicine Sleep Medicine

## 2021-09-27 DIAGNOSIS — S299XXS Unspecified injury of thorax, sequela: Secondary | ICD-10-CM | POA: Diagnosis not present

## 2021-09-27 DIAGNOSIS — R4182 Altered mental status, unspecified: Secondary | ICD-10-CM | POA: Diagnosis not present

## 2021-09-27 DIAGNOSIS — J398 Other specified diseases of upper respiratory tract: Secondary | ICD-10-CM | POA: Diagnosis not present

## 2021-09-27 DIAGNOSIS — J9621 Acute and chronic respiratory failure with hypoxia: Secondary | ICD-10-CM | POA: Diagnosis not present

## 2021-09-27 LAB — MAGNESIUM: Magnesium: 1.6 mg/dL — ABNORMAL LOW (ref 1.7–2.4)

## 2021-09-27 NOTE — Progress Notes (Signed)
Pulmonary Critical Care Medicine [facility]   PULMONARY CRITICAL CARE SERVICE  PROGRESS NOTE     Dana Noble  WNI:[id number]  DOB: Jul 11, 1986   DOA: 08/28/2021  Referring Physician: Luna Kitchens, MD  HPI: Dana Noble is a 36 y.o. female being followed for ventilator/airway/oxygen weaning Acute on Chronic Respiratory Failure.  Patient is on T-piece has been on 20% FiO2 saturations are good  Medications: Reviewed on Rounds  Physical Exam:  Vitals: Temperature 96.9 pulse 83 respiratory rate is 30 blood pressure is 143/73 saturations 97%  Ventilator Settings on T-piece FiO2 28%  General: Comfortable at this time Neck: supple Cardiovascular: no malignant arrhythmias Respiratory: Scattered rhonchi expansion is equal Skin: no rash seen on limited exam Musculoskeletal: No gross abnormality Psychiatric:unable to assess Neurologic:no involuntary movements         Lab Data:   Basic Metabolic Panel: Recent Labs  Lab 09/22/21 0434 09/26/21 0443 09/27/21 0305  NA 142 140  --   K 4.0 4.6  --   CL 106 101  --   CO2 29 32  --   GLUCOSE 107* 103*  --   BUN 18 15  --   CREATININE 0.46 0.42*  --   CALCIUM 9.1 9.3  --   MG 1.8 1.6* 1.6*  PHOS 4.2  --   --     ABG: Recent Labs  Lab 09/21/21 2211  PHART 7.466*  PCO2ART 41.7  PO2ART 62.5*  HCO3 29.8*  O2SAT 93.3    Liver Function Tests: No results for input(s): AST, ALT, ALKPHOS, BILITOT, PROT, ALBUMIN in the last 168 hours. No results for input(s): LIPASE, AMYLASE in the last 168 hours. No results for input(s): AMMONIA in the last 168 hours.  CBC: Recent Labs  Lab 09/22/21 0434 09/26/21 0443  WBC 7.0 5.8  HGB 10.7* 11.1*  HCT 34.7* 36.3  MCV 86.1 84.6  PLT 407* 399    Cardiac Enzymes: No results for input(s): CKTOTAL, CKMB, CKMBINDEX, TROPONINI in the last 168 hours.  BNP (last 3 results) No results for input(s): BNP in the last 8760 hours.  ProBNP (last 3  results) No results for input(s): PROBNP in the last 8760 hours.  Radiological Exams: DG Abd 1 View  Result Date: 09/26/2021 CLINICAL DATA:  Vomiting. EXAM: ABDOMEN - 1 VIEW COMPARISON:  Swallow function evaluation performed on September 26, 2021. FINDINGS: Small amount of barium is present in the stomach related to recent swallow function. Gastrostomy tube projects over the gastric body. EKG leads project over the patient's lower abdomen extending above the field of view to the lower chest. Ascending, transverse and descending colon. Moderate gaseous distension of the sigmoid. Some stool suggested in the area of the rectum. Sigmoid distension is similar to the exam of September 04, 2021 based on comparison with scout radiograph. Signs of sacroiliac fusion and prior ORIF along the pubic bone which are incompletely evaluated. No acute skeletal process on limited assessment. IMPRESSION: 1. No acute findings with moderate stool in the colon query developing constipation. 2. Moderate sigmoid distension similar to the scout images from the prior CT, suggest attention on follow-up. Electronically Signed   By: Donzetta Kohut M.D.   On: 09/26/2021 14:53    Assessment/Plan Active Problems:   Acute on chronic respiratory failure with hypoxia (HCC)   Tracheostomy status (HCC)   Altered mental status, unspecified   Multiple trauma to chest   Increased tracheal secretions   Acute on chronic respiratory failure hypoxia plan  is going to be to continue with the T-piece patient is at baseline secretions are minimal Tracheostomy will remain in place Altered mental state no change we will continue to monitor along closely. Multiple chest trauma supportive care clinically is improving Increased tracheal secretions slow to improve we will continue to monitor   I have personally seen and evaluated the patient, evaluated laboratory and imaging results, formulated the assessment and plan and placed orders. The Patient  requires high complexity decision making with multiple systems involvement.  Rounds were done with the Respiratory Therapy Director and Staff therapists and discussed with nursing staff also.  Yevonne Pax, MD [facility] Pulmonary Critical Care Medicine Sleep Medicine

## 2021-09-28 DIAGNOSIS — J398 Other specified diseases of upper respiratory tract: Secondary | ICD-10-CM | POA: Diagnosis not present

## 2021-09-28 DIAGNOSIS — J9621 Acute and chronic respiratory failure with hypoxia: Secondary | ICD-10-CM | POA: Diagnosis not present

## 2021-09-28 DIAGNOSIS — R4182 Altered mental status, unspecified: Secondary | ICD-10-CM | POA: Diagnosis not present

## 2021-09-28 DIAGNOSIS — S299XXS Unspecified injury of thorax, sequela: Secondary | ICD-10-CM | POA: Diagnosis not present

## 2021-09-28 LAB — MAGNESIUM: Magnesium: 1.5 mg/dL — ABNORMAL LOW (ref 1.7–2.4)

## 2021-09-28 NOTE — Progress Notes (Signed)
Pulmonary Critical Care Medicine [facility]   PULMONARY CRITICAL CARE SERVICE  PROGRESS NOTE     MADELIN WESEMAN  YHC:[id number]  DOB: [birth year]   DOA: 08/28/2021  Referring Physician: Luna Kitchens, MD  HPI: CALY PELLUM is a 36 y.o. female being followed for ventilator/airway/oxygen weaning Acute on Chronic Respiratory Failure.  Patient is on T-piece has been on 20% FiO2 using the PMV should be able to try capping trials today.  Medications: Reviewed on Rounds  Physical Exam:  Vitals: Temperature is 96.5 pulse 89 respiratory rate is 30 blood pressure is 112/68 saturations 98%  Ventilator Settings on T-piece FiO2 is 28%  General: Comfortable at this time Neck: supple Cardiovascular: no malignant arrhythmias Respiratory: No rhonchi no rales are noted at this time Skin: no rash seen on limited exam Musculoskeletal: No gross abnormality Psychiatric:unable to assess Neurologic:no involuntary movements         Lab Data:   Basic Metabolic Panel: Recent Labs  Lab 09/22/21 0434 09/26/21 0443 09/27/21 0305 09/28/21 0437  NA 142 140  --   --   K 4.0 4.6  --   --   CL 106 101  --   --   CO2 29 32  --   --   GLUCOSE 107* 103*  --   --   BUN 18 15  --   --   CREATININE 0.46 0.42*  --   --   CALCIUM 9.1 9.3  --   --   MG 1.8 1.6* 1.6* 1.5*  PHOS 4.2  --   --   --     ABG: Recent Labs  Lab 09/21/21 2211  PHART 7.466*  PCO2ART 41.7  PO2ART 62.5*  HCO3 29.8*  O2SAT 93.3    Liver Function Tests: No results for input(s): AST, ALT, ALKPHOS, BILITOT, PROT, ALBUMIN in the last 168 hours. No results for input(s): LIPASE, AMYLASE in the last 168 hours. No results for input(s): AMMONIA in the last 168 hours.  CBC: Recent Labs  Lab 09/22/21 0434 09/26/21 0443  WBC 7.0 5.8  HGB 10.7* 11.1*  HCT 34.7* 36.3  MCV 86.1 84.6  PLT 407* 399    Cardiac Enzymes: No results for input(s): CKTOTAL, CKMB, CKMBINDEX, TROPONINI in the last 168  hours.  BNP (last 3 results) No results for input(s): BNP in the last 8760 hours.  ProBNP (last 3 results) No results for input(s): PROBNP in the last 8760 hours.  Radiological Exams: DG Abd 1 View  Result Date: 09/26/2021 CLINICAL DATA:  Vomiting. EXAM: ABDOMEN - 1 VIEW COMPARISON:  Swallow function evaluation performed on September 26, 2021. FINDINGS: Small amount of barium is present in the stomach related to recent swallow function. Gastrostomy tube projects over the gastric body. EKG leads project over the patient's lower abdomen extending above the field of view to the lower chest. Ascending, transverse and descending colon. Moderate gaseous distension of the sigmoid. Some stool suggested in the area of the rectum. Sigmoid distension is similar to the exam of September 04, 2021 based on comparison with scout radiograph. Signs of sacroiliac fusion and prior ORIF along the pubic bone which are incompletely evaluated. No acute skeletal process on limited assessment. IMPRESSION: 1. No acute findings with moderate stool in the colon query developing constipation. 2. Moderate sigmoid distension similar to the scout images from the prior CT, suggest attention on follow-up. Electronically Signed   By: Donzetta Kohut M.D.   On: 09/26/2021 14:53  Assessment/Plan Active Problems:   Acute on chronic respiratory failure with hypoxia (HCC)   Tracheostomy status (HCC)   Altered mental status, unspecified   Multiple trauma to chest   Increased tracheal secretions   Acute on chronic respiratory failure hypoxia we will continue with T-piece titrate oxygen as tolerated continue pulmonary toilet and supportive care.  Today we will try her on capping if able to tolerate will advance Tracheostomy will remain in place we will continue to monitor Altered mental status no change we will continue with supportive care she is on more awake Chest trauma slowly improving we will continue to monitor closely Tracheal  secretions do appear to be improving   I have personally seen and evaluated the patient, evaluated laboratory and imaging results, formulated the assessment and plan and placed orders. The Patient requires high complexity decision making with multiple systems involvement.  Rounds were done with the Respiratory Therapy Director and Staff therapists and discussed with nursing staff also.  Yevonne Pax, MD [facility] Pulmonary Critical Care Medicine Sleep Medicine

## 2021-09-29 DIAGNOSIS — J9621 Acute and chronic respiratory failure with hypoxia: Secondary | ICD-10-CM | POA: Diagnosis not present

## 2021-09-29 DIAGNOSIS — J398 Other specified diseases of upper respiratory tract: Secondary | ICD-10-CM | POA: Diagnosis not present

## 2021-09-29 DIAGNOSIS — Z930 Tracheostomy status: Secondary | ICD-10-CM | POA: Diagnosis not present

## 2021-09-29 DIAGNOSIS — R4182 Altered mental status, unspecified: Secondary | ICD-10-CM | POA: Diagnosis not present

## 2021-09-29 LAB — RENAL FUNCTION PANEL
Albumin: 2.7 g/dL — ABNORMAL LOW (ref 3.5–5.0)
Anion gap: 7 (ref 5–15)
BUN: 11 mg/dL (ref 6–20)
CO2: 28 mmol/L (ref 22–32)
Calcium: 8.8 mg/dL — ABNORMAL LOW (ref 8.9–10.3)
Chloride: 99 mmol/L (ref 98–111)
Creatinine, Ser: 0.44 mg/dL (ref 0.44–1.00)
GFR, Estimated: >60 mL/min (ref >60)
Glucose, Bld: 101 mg/dL — ABNORMAL HIGH (ref 70–99)
Phosphorus: 4.2 mg/dL (ref 2.5–4.6)
Potassium: 4.5 mmol/L (ref 3.5–5.1)
Sodium: 134 mmol/L — ABNORMAL LOW (ref 135–145)

## 2021-09-29 LAB — CBC
HCT: 36 % (ref 36.0–46.0)
Hemoglobin: 11.4 g/dL — ABNORMAL LOW (ref 12.0–15.0)
MCH: 26.5 pg (ref 26.0–34.0)
MCHC: 31.7 g/dL (ref 30.0–36.0)
MCV: 83.5 fL (ref 80.0–100.0)
Platelets: 405 10*3/uL — ABNORMAL HIGH (ref 150–400)
RBC: 4.31 MIL/uL (ref 3.87–5.11)
RDW: 15.6 % — ABNORMAL HIGH (ref 11.5–15.5)
WBC: 5.4 10*3/uL (ref 4.0–10.5)
nRBC: 0 % (ref 0.0–0.2)

## 2021-09-29 LAB — MAGNESIUM: Magnesium: 1.8 mg/dL (ref 1.7–2.4)

## 2021-09-29 NOTE — Progress Notes (Signed)
Pulmonary Critical Care Medicine [facility]   PULMONARY CRITICAL CARE SERVICE  PROGRESS NOTE     Dana Noble  PJK:[id number]  DOB: 07/27/1986   DOA: 08/28/2021  Referring Physician: Luna Kitchens, MD  HPI: Dana Noble is a 36 y.o. female being followed for ventilator/airway/oxygen weaning Acute on Chronic Respiratory Failure.  Patient is capping on room air will be completing 24 hours  Medications: Reviewed on Rounds  Physical Exam:  Vitals: Temperature is 97.2 pulse 92 respiratory 13 blood pressure is 142/69 saturations 98%  Ventilator Settings capping on room air  General: Comfortable at this time Neck: supple Cardiovascular: no malignant arrhythmias Respiratory: No rhonchi no rales are noted at this time Skin: no rash seen on limited exam Musculoskeletal: No gross abnormality Psychiatric:unable to assess Neurologic:no involuntary movements         Lab Data:   Basic Metabolic Panel: Recent Labs  Lab 09/26/21 0443 09/27/21 0305 09/28/21 0437 09/29/21 0659  NA 140  --   --  134*  K 4.6  --   --  4.5  CL 101  --   --  99  CO2 32  --   --  28  GLUCOSE 103*  --   --  101*  BUN 15  --   --  11  CREATININE 0.42*  --   --  0.44  CALCIUM 9.3  --   --  8.8*  MG 1.6* 1.6* 1.5* 1.8  PHOS  --   --   --  4.2    ABG: No results for input(s): PHART, PCO2ART, PO2ART, HCO3, O2SAT in the last 168 hours.  Liver Function Tests: Recent Labs  Lab 09/29/21 0659  ALBUMIN 2.7*   No results for input(s): LIPASE, AMYLASE in the last 168 hours. No results for input(s): AMMONIA in the last 168 hours.  CBC: Recent Labs  Lab 09/26/21 0443 09/29/21 0659  WBC 5.8 5.4  HGB 11.1* 11.4*  HCT 36.3 36.0  MCV 84.6 83.5  PLT 399 405*    Cardiac Enzymes: No results for input(s): CKTOTAL, CKMB, CKMBINDEX, TROPONINI in the last 168 hours.  BNP (last 3 results) No results for input(s): BNP in the last 8760 hours.  ProBNP (last 3 results) No  results for input(s): PROBNP in the last 8760 hours.  Radiological Exams: No results found.  Assessment/Plan Active Problems:   Acute on chronic respiratory failure with hypoxia (HCC)   Tracheostomy status (HCC)   Altered mental status, unspecified   Multiple trauma to chest   Increased tracheal secretions   Acute on chronic respiratory failure hypoxia we will continue with capping as ordered patient should be completing 24 hours today Tracheostomy doing well with capping Altered mental status she appears to be back to her baseline Multiple chest trauma supportive care Secretions are improving   I have personally seen and evaluated the patient, evaluated laboratory and imaging results, formulated the assessment and plan and placed orders. The Patient requires high complexity decision making with multiple systems involvement.  Rounds were done with the Respiratory Therapy Director and Staff therapists and discussed with nursing staff also.  Yevonne Pax, MD [facility] Pulmonary Critical Care Medicine Sleep Medicine

## 2021-09-30 ENCOUNTER — Other Ambulatory Visit (HOSPITAL_COMMUNITY): Payer: Medicare Other

## 2021-10-01 DIAGNOSIS — R4182 Altered mental status, unspecified: Secondary | ICD-10-CM | POA: Diagnosis not present

## 2021-10-01 DIAGNOSIS — J398 Other specified diseases of upper respiratory tract: Secondary | ICD-10-CM | POA: Diagnosis not present

## 2021-10-01 DIAGNOSIS — S299XXS Unspecified injury of thorax, sequela: Secondary | ICD-10-CM | POA: Diagnosis not present

## 2021-10-01 DIAGNOSIS — J9621 Acute and chronic respiratory failure with hypoxia: Secondary | ICD-10-CM | POA: Diagnosis not present

## 2021-10-01 NOTE — Progress Notes (Signed)
Pulmonary Critical Care Medicine [facility]   PULMONARY CRITICAL CARE SERVICE  PROGRESS NOTE     Dana Noble  PFX:[id number]  DOB: 23-May-1986   DOA: 08/28/2021  Referring Physician: Luna Kitchens, MD  HPI: Dana Noble is a 36 y.o. female being followed for ventilator/airway/oxygen weaning Acute on Chronic Respiratory Failure.  Patient currently is capping has been on room air secretions have been off and on but patient has been completing 24 hours now going for 48 hours  Medications: Reviewed on Rounds  Physical Exam:  Vitals: Temperature is 97.1 pulse 95 respiratory 22 blood pressure is 147/74 saturations 97%  Ventilator Settings capping on room air  General: Comfortable at this time Neck: supple Cardiovascular: no malignant arrhythmias Respiratory: Scattered rhonchi very coarse breath sounds Skin: no rash seen on limited exam Musculoskeletal: No gross abnormality Psychiatric:unable to assess Neurologic:no involuntary movements         Lab Data:   Basic Metabolic Panel: Recent Labs  Lab 09/26/21 0443 09/27/21 0305 09/28/21 0437 09/29/21 0659  NA 140  --   --  134*  K 4.6  --   --  4.5  CL 101  --   --  99  CO2 32  --   --  28  GLUCOSE 103*  --   --  101*  BUN 15  --   --  11  CREATININE 0.42*  --   --  0.44  CALCIUM 9.3  --   --  8.8*  MG 1.6* 1.6* 1.5* 1.8  PHOS  --   --   --  4.2    ABG: No results for input(s): PHART, PCO2ART, PO2ART, HCO3, O2SAT in the last 168 hours.  Liver Function Tests: Recent Labs  Lab 09/29/21 0659  ALBUMIN 2.7*   No results for input(s): LIPASE, AMYLASE in the last 168 hours. No results for input(s): AMMONIA in the last 168 hours.  CBC: Recent Labs  Lab 09/26/21 0443 09/29/21 0659  WBC 5.8 5.4  HGB 11.1* 11.4*  HCT 36.3 36.0  MCV 84.6 83.5  PLT 399 405*    Cardiac Enzymes: No results for input(s): CKTOTAL, CKMB, CKMBINDEX, TROPONINI in the last 168 hours.  BNP (last 3  results) No results for input(s): BNP in the last 8760 hours.  ProBNP (last 3 results) No results for input(s): PROBNP in the last 8760 hours.  Radiological Exams: DG Abd Portable 1V  Result Date: 09/30/2021 CLINICAL DATA:  36 year old female with history of abdominal distension. Evaluate for ileus. EXAM: PORTABLE ABDOMEN - 1 VIEW COMPARISON:  Abdominal radiograph 09/26/2021. FINDINGS: Gas and stool are noted throughout the colon and rectum. Mild gaseous distension of the colon in the mid abdomen. No pathologic dilatation of small bowel. No gross evidence of pneumoperitoneum on this single supine view. Fixation hardware in the pelvis again noted. IMPRESSION: 1. Nonspecific, nonobstructive bowel gas pattern, as above. Electronically Signed   By: Trudie Reed M.D.   On: 09/30/2021 08:05    Assessment/Plan Active Problems:   Acute on chronic respiratory failure with hypoxia (HCC)   Tracheostomy status (HCC)   Altered mental status, unspecified   Multiple trauma to chest   Increased tracheal secretions   Acute on chronic respiratory failure hypoxia continue with the capping trials patient is supposed to complete over 48 hours Tracheostomy remains in place at this time Altered mental status improving back to baseline Multiple chest trauma supportive care Retained secretions slowly improving   I have personally  seen and evaluated the patient, evaluated laboratory and imaging results, formulated the assessment and plan and placed orders. The Patient requires high complexity decision making with multiple systems involvement.  Rounds were done with the Respiratory Therapy Director and Staff therapists and discussed with nursing staff also.  Yevonne Pax, MD [facility] Pulmonary Critical Care Medicine Sleep Medicine

## 2021-10-02 DIAGNOSIS — J9621 Acute and chronic respiratory failure with hypoxia: Secondary | ICD-10-CM | POA: Diagnosis not present

## 2021-10-02 DIAGNOSIS — R4182 Altered mental status, unspecified: Secondary | ICD-10-CM | POA: Diagnosis not present

## 2021-10-02 DIAGNOSIS — J398 Other specified diseases of upper respiratory tract: Secondary | ICD-10-CM | POA: Diagnosis not present

## 2021-10-02 DIAGNOSIS — S299XXS Unspecified injury of thorax, sequela: Secondary | ICD-10-CM | POA: Diagnosis not present

## 2021-10-02 LAB — BASIC METABOLIC PANEL
Anion gap: 9 (ref 5–15)
BUN: 12 mg/dL (ref 6–20)
CO2: 28 mmol/L (ref 22–32)
Calcium: 9.5 mg/dL (ref 8.9–10.3)
Chloride: 98 mmol/L (ref 98–111)
Creatinine, Ser: 0.47 mg/dL (ref 0.44–1.00)
GFR, Estimated: >60 mL/min (ref >60)
Glucose, Bld: 158 mg/dL — ABNORMAL HIGH (ref 70–99)
Potassium: 4.4 mmol/L (ref 3.5–5.1)
Sodium: 135 mmol/L (ref 135–145)

## 2021-10-02 LAB — CBC
HCT: 37.8 % (ref 36.0–46.0)
Hemoglobin: 12.3 g/dL (ref 12.0–15.0)
MCH: 26.7 pg (ref 26.0–34.0)
MCHC: 32.5 g/dL (ref 30.0–36.0)
MCV: 82.2 fL (ref 80.0–100.0)
Platelets: 469 10*3/uL — ABNORMAL HIGH (ref 150–400)
RBC: 4.6 MIL/uL (ref 3.87–5.11)
RDW: 15.7 % — ABNORMAL HIGH (ref 11.5–15.5)
WBC: 6.2 10*3/uL (ref 4.0–10.5)
nRBC: 0 % (ref 0.0–0.2)

## 2021-10-02 LAB — LEVETIRACETAM LEVEL: Levetiracetam Lvl: <2 ug/mL — ABNORMAL LOW (ref 10.0–40.0)

## 2021-10-02 LAB — MAGNESIUM: Magnesium: 1.5 mg/dL — ABNORMAL LOW (ref 1.7–2.4)

## 2021-10-02 LAB — PHOSPHORUS: Phosphorus: 4.5 mg/dL (ref 2.5–4.6)

## 2021-10-02 NOTE — Progress Notes (Signed)
Pulmonary Critical Care Medicine [facility]   PULMONARY CRITICAL CARE SERVICE  PROGRESS NOTE     Dana Noble  GYB:[id number]  DOB: 12-31-1985   DOA: 08/28/2021  Referring Physician: Luna Kitchens, MD  HPI: Dana Noble is a 36 y.o. female being followed for ventilator/airway/oxygen weaning Acute on Chronic Respiratory Failure.  She is awake and alert conversing with the examiner no issues are noted.  She is Minimal secretions reported which she is able to clear on her own  Medications: Reviewed on Rounds  Physical Exam:  Vitals: Temperature is 97.6 pulse 90 respiratory rate is 19 blood pressure is 101/74 saturations 98%  Ventilator Settings capping off the ventilator  General: Comfortable at this time Neck: supple Cardiovascular: no malignant arrhythmias Respiratory: No rhonchi no rales noted at this time Skin: no rash seen on limited exam Musculoskeletal: No gross abnormality Psychiatric:unable to assess Neurologic:no involuntary movements         Lab Data:   Basic Metabolic Panel: Recent Labs  Lab 09/26/21 0443 09/27/21 0305 09/28/21 0437 09/29/21 0659 10/02/21 0802  NA 140  --   --  134* 135  K 4.6  --   --  4.5 4.4  CL 101  --   --  99 98  CO2 32  --   --  28 28  GLUCOSE 103*  --   --  101* 158*  BUN 15  --   --  11 12  CREATININE 0.42*  --   --  0.44 0.47  CALCIUM 9.3  --   --  8.8* 9.5  MG 1.6* 1.6* 1.5* 1.8 1.5*  PHOS  --   --   --  4.2 4.5    ABG: No results for input(s): PHART, PCO2ART, PO2ART, HCO3, O2SAT in the last 168 hours.  Liver Function Tests: Recent Labs  Lab 09/29/21 0659  ALBUMIN 2.7*   No results for input(s): LIPASE, AMYLASE in the last 168 hours. No results for input(s): AMMONIA in the last 168 hours.  CBC: Recent Labs  Lab 09/26/21 0443 09/29/21 0659 10/02/21 0802  WBC 5.8 5.4 6.2  HGB 11.1* 11.4* 12.3  HCT 36.3 36.0 37.8  MCV 84.6 83.5 82.2  PLT 399 405* 469*    Cardiac  Enzymes: No results for input(s): CKTOTAL, CKMB, CKMBINDEX, TROPONINI in the last 168 hours.  BNP (last 3 results) No results for input(s): BNP in the last 8760 hours.  ProBNP (last 3 results) No results for input(s): PROBNP in the last 8760 hours.  Radiological Exams: No results found.  Assessment/Plan Active Problems:   Acute on chronic respiratory failure with hypoxia (HCC)   Tracheostomy status (HCC)   Altered mental status, unspecified   Multiple trauma to chest   Increased tracheal secretions   Acute on chronic respiratory failure hypoxia we will proceed to decannulation today Tracheostomy will be removed Altered mental status improved Multiple chest trauma Improving Secretions improved   I have personally seen and evaluated the patient, evaluated laboratory and imaging results, formulated the assessment and plan and placed orders. The Patient requires high complexity decision making with multiple systems involvement.  Rounds were done with the Respiratory Therapy Director and Staff therapists and discussed with nursing staff also.  Yevonne Pax, MD [facility] Pulmonary Critical Care Medicine Sleep Medicine

## 2021-10-04 LAB — MAGNESIUM: Magnesium: 1.5 mg/dL — ABNORMAL LOW (ref 1.7–2.4)

## 2021-10-06 LAB — CBC
HCT: 35.8 % — ABNORMAL LOW (ref 36.0–46.0)
Hemoglobin: 11.2 g/dL — ABNORMAL LOW (ref 12.0–15.0)
MCH: 25.9 pg — ABNORMAL LOW (ref 26.0–34.0)
MCHC: 31.3 g/dL (ref 30.0–36.0)
MCV: 82.9 fL (ref 80.0–100.0)
Platelets: 467 10*3/uL — ABNORMAL HIGH (ref 150–400)
RBC: 4.32 MIL/uL (ref 3.87–5.11)
RDW: 15.3 % (ref 11.5–15.5)
WBC: 5.5 10*3/uL (ref 4.0–10.5)
nRBC: 0 % (ref 0.0–0.2)

## 2021-10-06 LAB — BASIC METABOLIC PANEL
Anion gap: 8 (ref 5–15)
BUN: 9 mg/dL (ref 6–20)
CO2: 27 mmol/L (ref 22–32)
Calcium: 9 mg/dL (ref 8.9–10.3)
Chloride: 98 mmol/L (ref 98–111)
Creatinine, Ser: 0.4 mg/dL — ABNORMAL LOW (ref 0.44–1.00)
GFR, Estimated: >60 mL/min (ref >60)
Glucose, Bld: 187 mg/dL — ABNORMAL HIGH (ref 70–99)
Potassium: 4 mmol/L (ref 3.5–5.1)
Sodium: 133 mmol/L — ABNORMAL LOW (ref 135–145)

## 2021-10-06 LAB — MAGNESIUM: Magnesium: 1.3 mg/dL — ABNORMAL LOW (ref 1.7–2.4)

## 2021-10-06 LAB — PHOSPHORUS: Phosphorus: 4.6 mg/dL (ref 2.5–4.6)

## 2021-10-07 LAB — BASIC METABOLIC PANEL
Anion gap: 9 (ref 5–15)
BUN: 11 mg/dL (ref 6–20)
CO2: 24 mmol/L (ref 22–32)
Calcium: 8.7 mg/dL — ABNORMAL LOW (ref 8.9–10.3)
Chloride: 98 mmol/L (ref 98–111)
Creatinine, Ser: 0.4 mg/dL — ABNORMAL LOW (ref 0.44–1.00)
GFR, Estimated: >60 mL/min (ref >60)
Glucose, Bld: 151 mg/dL — ABNORMAL HIGH (ref 70–99)
Potassium: 4.3 mmol/L (ref 3.5–5.1)
Sodium: 131 mmol/L — ABNORMAL LOW (ref 135–145)

## 2021-10-07 LAB — MAGNESIUM: Magnesium: 1.4 mg/dL — ABNORMAL LOW (ref 1.7–2.4)

## 2021-10-07 LAB — PHOSPHORUS: Phosphorus: 4.8 mg/dL — ABNORMAL HIGH (ref 2.5–4.6)

## 2021-10-08 LAB — BASIC METABOLIC PANEL
Anion gap: 6 (ref 5–15)
BUN: 14 mg/dL (ref 6–20)
CO2: 29 mmol/L (ref 22–32)
Calcium: 8.9 mg/dL (ref 8.9–10.3)
Chloride: 101 mmol/L (ref 98–111)
Creatinine, Ser: 0.41 mg/dL — ABNORMAL LOW (ref 0.44–1.00)
GFR, Estimated: >60 mL/min (ref >60)
Glucose, Bld: 114 mg/dL — ABNORMAL HIGH (ref 70–99)
Potassium: 4.5 mmol/L (ref 3.5–5.1)
Sodium: 136 mmol/L (ref 135–145)

## 2021-10-08 LAB — MAGNESIUM: Magnesium: 1.7 mg/dL (ref 1.7–2.4)

## 2021-10-09 LAB — MAGNESIUM: Magnesium: 2.2 mg/dL (ref 1.7–2.4)

## 2021-10-13 LAB — RENAL FUNCTION PANEL
Albumin: 3 g/dL — ABNORMAL LOW (ref 3.5–5.0)
Anion gap: 11 (ref 5–15)
BUN: 11 mg/dL (ref 6–20)
CO2: 24 mmol/L (ref 22–32)
Calcium: 9.2 mg/dL (ref 8.9–10.3)
Chloride: 99 mmol/L (ref 98–111)
Creatinine, Ser: 0.45 mg/dL (ref 0.44–1.00)
GFR, Estimated: >60 mL/min (ref >60)
Glucose, Bld: 114 mg/dL — ABNORMAL HIGH (ref 70–99)
Phosphorus: 5.3 mg/dL — ABNORMAL HIGH (ref 2.5–4.6)
Potassium: 4.4 mmol/L (ref 3.5–5.1)
Sodium: 134 mmol/L — ABNORMAL LOW (ref 135–145)

## 2021-10-13 LAB — CBC
HCT: 36.6 % (ref 36.0–46.0)
Hemoglobin: 11.8 g/dL — ABNORMAL LOW (ref 12.0–15.0)
MCH: 26.1 pg (ref 26.0–34.0)
MCHC: 32.2 g/dL (ref 30.0–36.0)
MCV: 81 fL (ref 80.0–100.0)
Platelets: 458 10*3/uL — ABNORMAL HIGH (ref 150–400)
RBC: 4.52 MIL/uL (ref 3.87–5.11)
RDW: 15.3 % (ref 11.5–15.5)
WBC: 6.8 10*3/uL (ref 4.0–10.5)
nRBC: 0 % (ref 0.0–0.2)

## 2021-10-13 LAB — MAGNESIUM: Magnesium: 1.5 mg/dL — ABNORMAL LOW (ref 1.7–2.4)

## 2021-10-17 LAB — CBC
HCT: 32.1 % — ABNORMAL LOW (ref 36.0–46.0)
Hemoglobin: 10 g/dL — ABNORMAL LOW (ref 12.0–15.0)
MCH: 25.2 pg — ABNORMAL LOW (ref 26.0–34.0)
MCHC: 31.2 g/dL (ref 30.0–36.0)
MCV: 80.9 fL (ref 80.0–100.0)
Platelets: 417 10*3/uL — ABNORMAL HIGH (ref 150–400)
RBC: 3.97 MIL/uL (ref 3.87–5.11)
RDW: 15 % (ref 11.5–15.5)
WBC: 6.2 10*3/uL (ref 4.0–10.5)
nRBC: 0 % (ref 0.0–0.2)

## 2021-10-17 LAB — BASIC METABOLIC PANEL
Anion gap: 8 (ref 5–15)
BUN: 5 mg/dL — ABNORMAL LOW (ref 6–20)
CO2: 27 mmol/L (ref 22–32)
Calcium: 9 mg/dL (ref 8.9–10.3)
Chloride: 100 mmol/L (ref 98–111)
Creatinine, Ser: 0.52 mg/dL (ref 0.44–1.00)
GFR, Estimated: >60 mL/min (ref >60)
Glucose, Bld: 159 mg/dL — ABNORMAL HIGH (ref 70–99)
Potassium: 3.9 mmol/L (ref 3.5–5.1)
Sodium: 135 mmol/L (ref 135–145)

## 2021-10-17 LAB — MAGNESIUM: Magnesium: 1.4 mg/dL — ABNORMAL LOW (ref 1.7–2.4)

## 2021-10-17 LAB — PHOSPHORUS: Phosphorus: 5.1 mg/dL — ABNORMAL HIGH (ref 2.5–4.6)

## 2021-10-20 LAB — BASIC METABOLIC PANEL
Anion gap: 7 (ref 5–15)
BUN: 9 mg/dL (ref 6–20)
CO2: 26 mmol/L (ref 22–32)
Calcium: 8.9 mg/dL (ref 8.9–10.3)
Chloride: 100 mmol/L (ref 98–111)
Creatinine, Ser: 0.46 mg/dL (ref 0.44–1.00)
GFR, Estimated: >60 mL/min (ref >60)
Glucose, Bld: 110 mg/dL — ABNORMAL HIGH (ref 70–99)
Potassium: 4 mmol/L (ref 3.5–5.1)
Sodium: 133 mmol/L — ABNORMAL LOW (ref 135–145)

## 2021-10-20 LAB — MAGNESIUM
Magnesium: 1.7 mg/dL (ref 1.7–2.4)
Magnesium: 1.6 mg/dL — ABNORMAL LOW (ref 1.7–2.4)

## 2021-10-21 LAB — MAGNESIUM: Magnesium: 1.5 mg/dL — ABNORMAL LOW (ref 1.7–2.4)

## 2021-10-22 LAB — BASIC METABOLIC PANEL
Anion gap: 8 (ref 5–15)
BUN: 9 mg/dL (ref 6–20)
CO2: 27 mmol/L (ref 22–32)
Calcium: 9.1 mg/dL (ref 8.9–10.3)
Chloride: 100 mmol/L (ref 98–111)
Creatinine, Ser: 0.45 mg/dL (ref 0.44–1.00)
GFR, Estimated: >60 mL/min (ref >60)
Glucose, Bld: 108 mg/dL — ABNORMAL HIGH (ref 70–99)
Potassium: 4 mmol/L (ref 3.5–5.1)
Sodium: 135 mmol/L (ref 135–145)

## 2021-10-22 LAB — MAGNESIUM: Magnesium: 1.5 mg/dL — ABNORMAL LOW (ref 1.7–2.4)

## 2021-10-23 ENCOUNTER — Other Ambulatory Visit (HOSPITAL_COMMUNITY): Payer: Medicare Other

## 2021-10-23 LAB — MAGNESIUM: Magnesium: 1.6 mg/dL — ABNORMAL LOW (ref 1.7–2.4)

## 2021-10-24 LAB — BASIC METABOLIC PANEL
Anion gap: 8 (ref 5–15)
BUN: 9 mg/dL (ref 6–20)
CO2: 27 mmol/L (ref 22–32)
Calcium: 9.1 mg/dL (ref 8.9–10.3)
Chloride: 102 mmol/L (ref 98–111)
Creatinine, Ser: 0.45 mg/dL (ref 0.44–1.00)
GFR, Estimated: >60 mL/min (ref >60)
Glucose, Bld: 88 mg/dL (ref 70–99)
Potassium: 4.1 mmol/L (ref 3.5–5.1)
Sodium: 137 mmol/L (ref 135–145)

## 2021-10-24 LAB — CBC
HCT: 34.4 % — ABNORMAL LOW (ref 36.0–46.0)
Hemoglobin: 11.1 g/dL — ABNORMAL LOW (ref 12.0–15.0)
MCH: 26.2 pg (ref 26.0–34.0)
MCHC: 32.3 g/dL (ref 30.0–36.0)
MCV: 81.1 fL (ref 80.0–100.0)
Platelets: 410 10*3/uL — ABNORMAL HIGH (ref 150–400)
RBC: 4.24 MIL/uL (ref 3.87–5.11)
RDW: 14.9 % (ref 11.5–15.5)
WBC: 5.3 10*3/uL (ref 4.0–10.5)
nRBC: 0 % (ref 0.0–0.2)

## 2021-10-24 LAB — MAGNESIUM: Magnesium: 1.5 mg/dL — ABNORMAL LOW (ref 1.7–2.4)

## 2021-10-25 LAB — MAGNESIUM: Magnesium: 1.6 mg/dL — ABNORMAL LOW (ref 1.7–2.4)

## 2021-10-26 LAB — MAGNESIUM: Magnesium: 1.7 mg/dL (ref 1.7–2.4)

## 2021-10-28 LAB — MAGNESIUM
Magnesium: 1.6 mg/dL — ABNORMAL LOW (ref 1.7–2.4)
Magnesium: 2 mg/dL (ref 1.7–2.4)

## 2021-11-20 ENCOUNTER — Encounter (HOSPITAL_COMMUNITY): Payer: Self-pay | Admitting: Radiology

## 2021-11-28 ENCOUNTER — Other Ambulatory Visit (HOSPITAL_COMMUNITY): Payer: Self-pay | Admitting: Physical Medicine and Rehabilitation

## 2021-11-28 DIAGNOSIS — I639 Cerebral infarction, unspecified: Secondary | ICD-10-CM

## 2021-11-29 ENCOUNTER — Ambulatory Visit (HOSPITAL_COMMUNITY)
Admission: RE | Admit: 2021-11-29 | Discharge: 2021-11-29 | Disposition: A | Discharge status: Home / Self Care | Payer: No Typology Code available for payment source | Source: Ambulatory Visit | Attending: Physical Medicine and Rehabilitation | Admitting: Physical Medicine and Rehabilitation

## 2021-11-29 DIAGNOSIS — Z431 Encounter for attention to gastrostomy: Secondary | ICD-10-CM | POA: Insufficient documentation

## 2021-11-29 DIAGNOSIS — I639 Cerebral infarction, unspecified: Secondary | ICD-10-CM | POA: Insufficient documentation

## 2021-11-29 HISTORY — PX: IR GASTROSTOMY TUBE REMOVAL: IMG5492

## 2021-11-29 NOTE — Procedures (Signed)
PROCEDURE SUMMARY: ? ?Successful removal of gastrostomy tube. ? ?No complications.  ? ?EBL = none ? ?Please see full dictation in imaging section of Epic for procedure details. ? ?Discuss below with the patient.  ?- no shower for 24 hours  ?- keep the dressing on while taking shower, take off the dressing immediately after shower and pat dry the area completely before place a new dressing ?- may place a new dressing until fully heals ?- do not submerge (sitting in a tub, swimming) the previous G tube site for 7 days   ?- recommended eating small meals for couple days, inform the patient that there may be a small leak from the previous G tube site ?- encourage to reach out to IR for questions and concerns  ? ?Patient verbalized understanding ? ? ?Willette Brace PA-C ?11/29/2021 2:53 PM ? ? ? ? ? ? ?

## 2024-01-24 IMAGING — MR MR CERVICAL SPINE W/O CM
4 of 5 series · 18 of 48 positions shown · non-contrast
Comparison: None available.

CLINICAL DATA: Follow-up examination for spinal cord injury.

EXAM:
MRI CERVICAL SPINE WITHOUT CONTRAST
TECHNIQUE: Multiplanar, multisequence MR imaging of the cervical spine was
performed. No intravenous contrast was administered.

[Series 3: T2 · sagittal · 3.0mm · 0.35mm/px · 3 of 16 slices shown (1 of 2)]
[im 1/16]
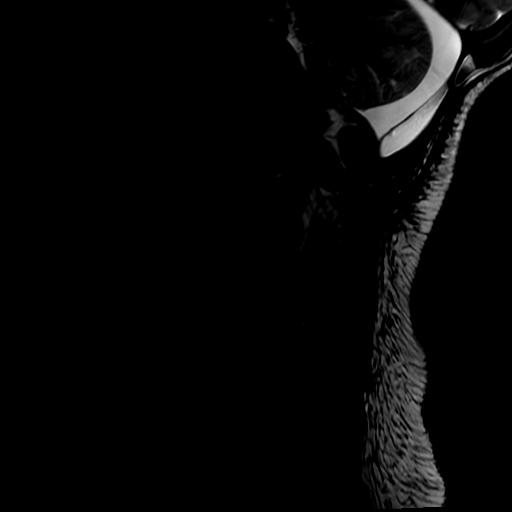
[im 8/16]
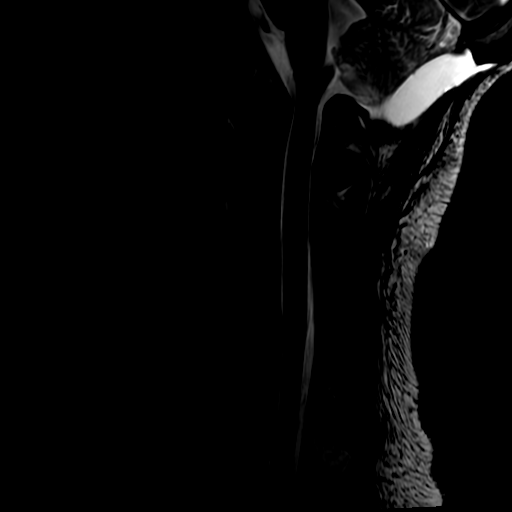
[im 16/16]
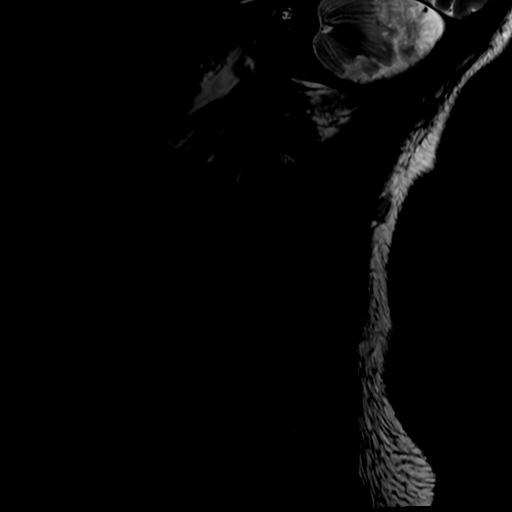

[Series 4: FLAIR · sagittal · 3.0mm · 0.35mm/px · 3 of 16 slices shown]
[im 1/16]
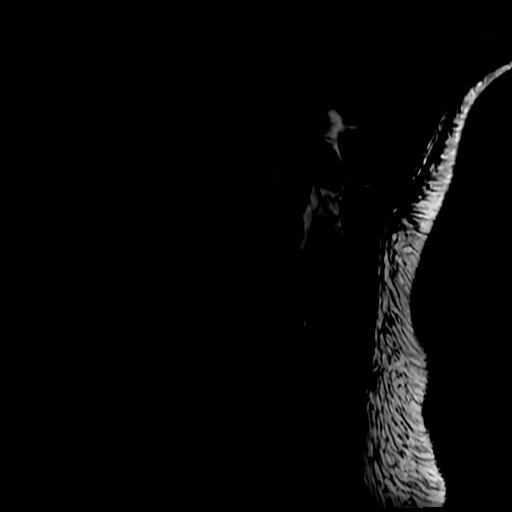
[im 11/16]
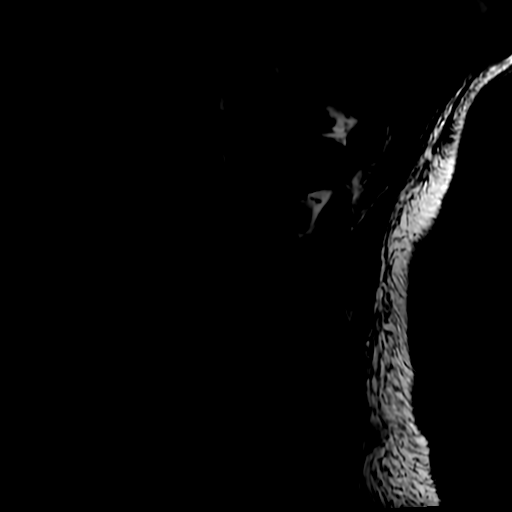
[im 16/16]
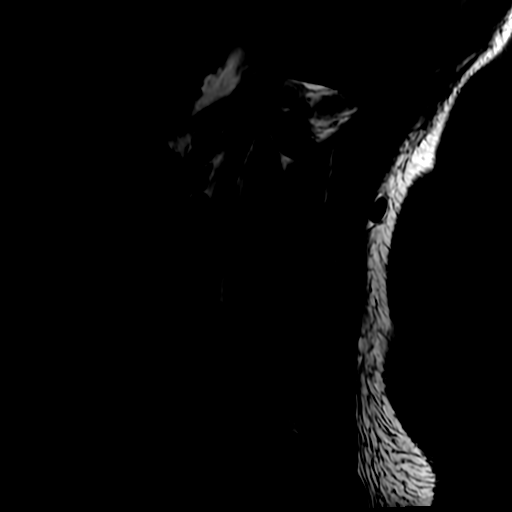

[Series 5: STIR · sagittal · 3.0mm · 0.35mm/px · 3 of 16 slices shown]
[im 1/16]
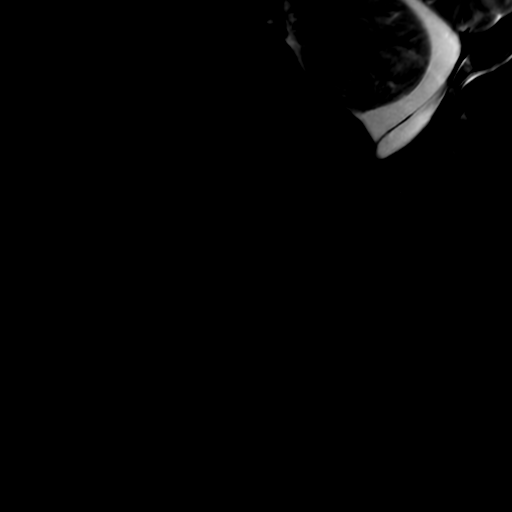
[im 11/16]
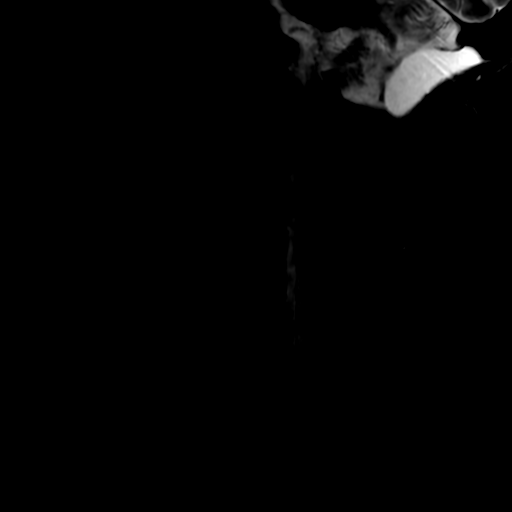
[im 16/16]
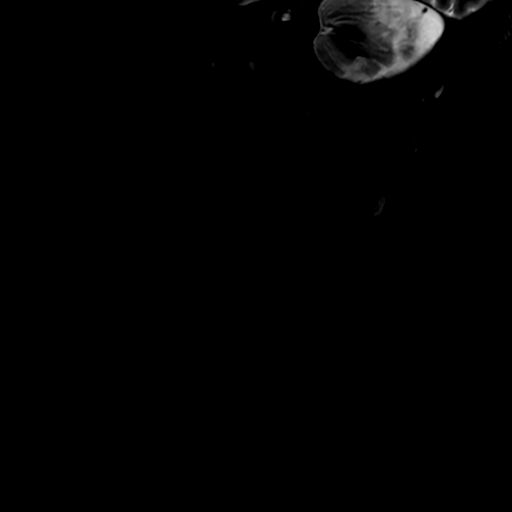

[Series 7: T2 · axial · 3.0mm · 0.35mm/px · z∈[-65,+61]mm · 9 of 40 slices shown (2 of 2)]
[im 1/40]
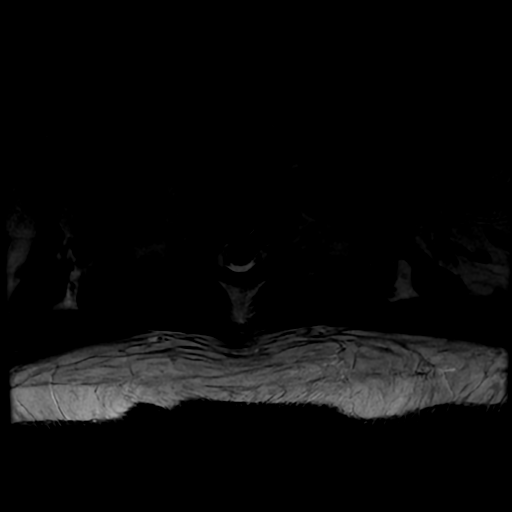
[im 5/40]
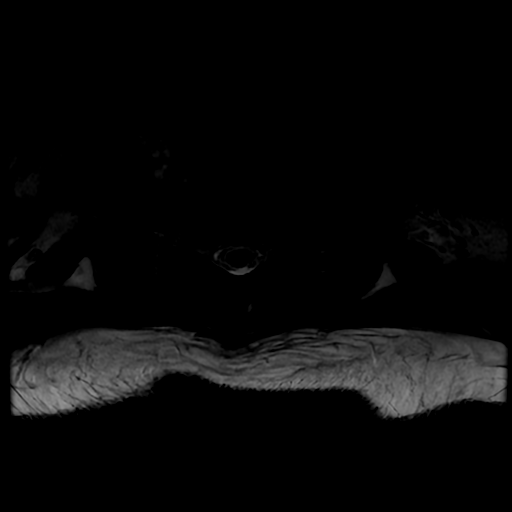
[im 10/40]
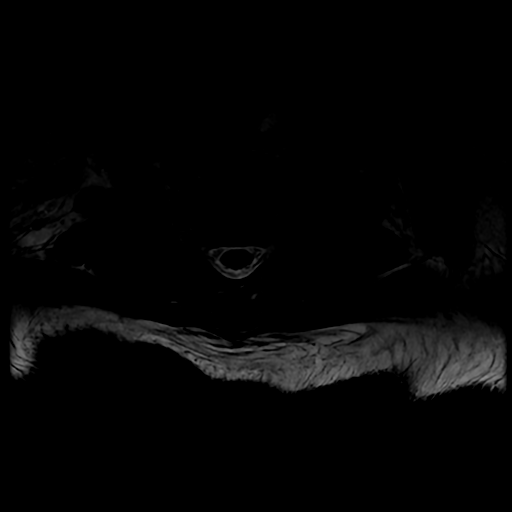
[im 15/40]
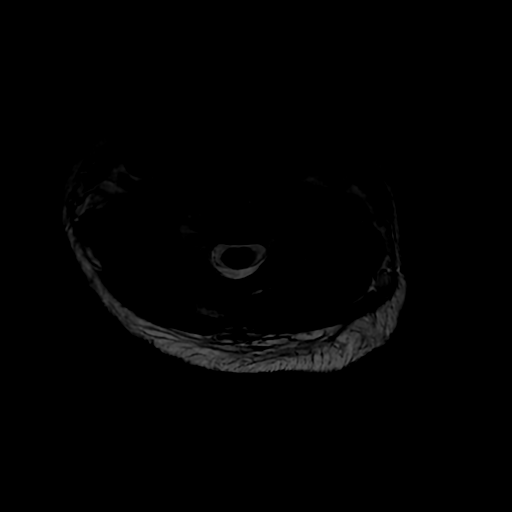
[im 20/40]
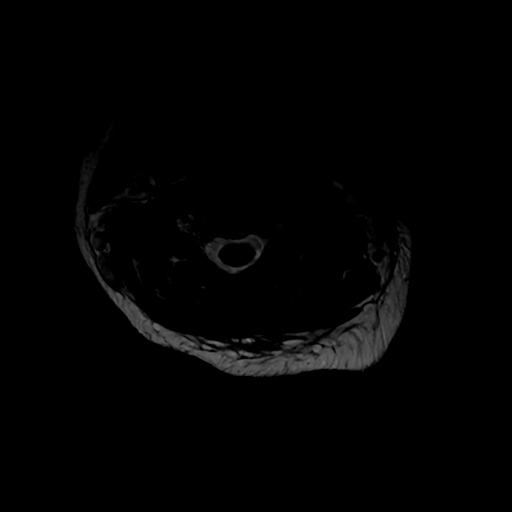
[im 25/40]
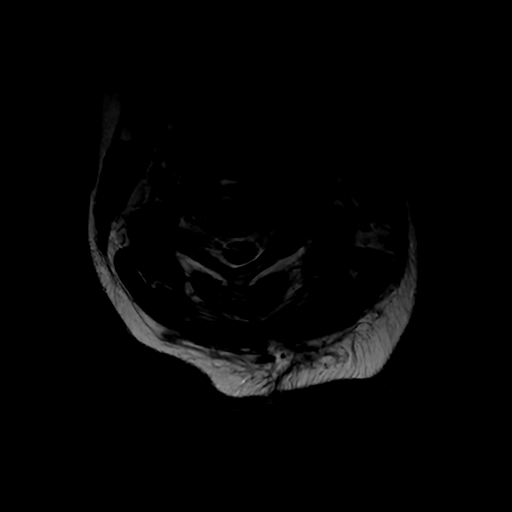
[im 30/40]
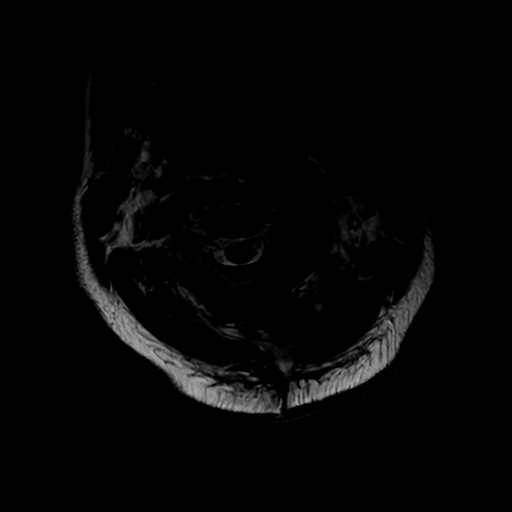
[im 35/40]
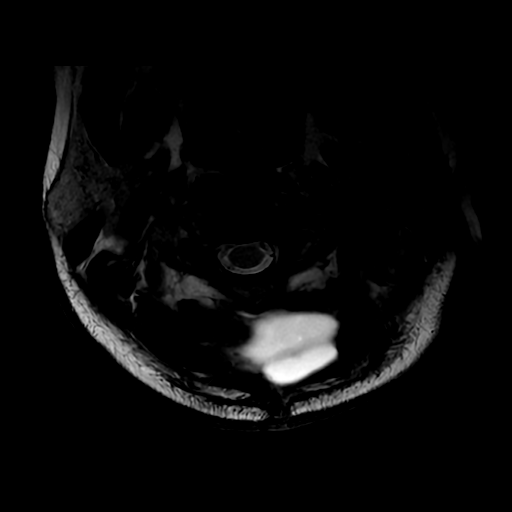
[im 40/40]
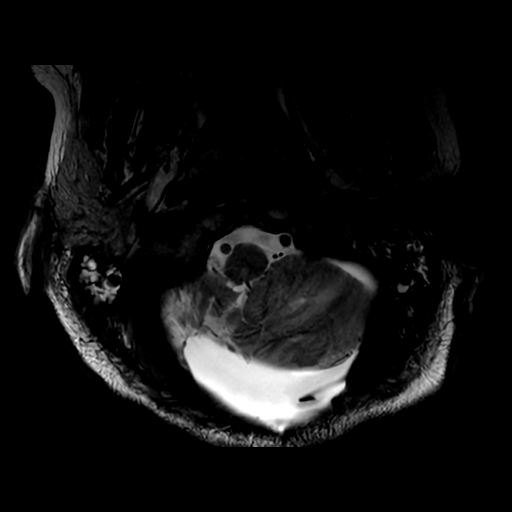

[18 of 48 positions shown; findings below may reference images not displayed]

FINDINGS: Alignment: Straightening with mild smooth reversal of the normal
cervical lordosis. No listhesis.

Vertebrae: Vertebral body height maintained without acute or chronic
fracture. Bone marrow signal intensity within normal limits. No
discrete or worrisome osseous lesions or abnormal marrow edema.

Cord: Normal signal and morphology. No visible cord signal changes
to suggest cord injury on this mildly motion degraded exam.

Posterior Fossa, vertebral arteries, paraspinal tissues:
Postoperative changes related to prior suboccipital craniectomy
partially visualized at the posterior skull base. Small
benign-appearing collection at the craniectomy site measures 2.6 x
1.0 x 3.4 cm changes related to prior right cerebellar infarct
partially visualized. Absent flow void within the right vertebral
artery, likely occluded. Bilateral mastoid effusions partially
visualized. Paraspinous soft tissues demonstrate no other acute
finding. Heterogeneous left thyroid nodule measures up to 2.4 cm in
diameter (series 7, image 33).

Disc levels:

No significant disc pathology seen within the cervical spine. No
disc bulge or focal disc herniation. No significant facet pathology.
No spinal stenosis or neural foraminal narrowing. No neural
impingement.
IMPRESSION: 1. No MRI evidence for acute spinal cord injury.
2. Changes related to recent right cerebellar infarct and
suboccipital craniectomy. 2.6 x 1.0 x 3.4 cm benign appearing
collection at the craniectomy site.
3. Absent flow void within the right vertebral artery, likely
occluded.
4. No significant disc pathology or stenosis within the cervical
spine. No neural impingement.
5. 3.4 cm left thyroid nodule, indeterminate. Further evaluation
with dedicated thyroid ultrasound recommended. This could be
performed on a nonemergent basis. (Ref: [HOSPITAL]. [id number]
